# Patient Record
Sex: Female | Born: 1985 | Race: Black or African American | Hispanic: No | Marital: Married | Smoking: Former smoker
Health system: Southern US, Community
[De-identification: ages and names within clinical notes are randomized; demographics above are authoritative.]

## PROBLEM LIST (undated history)

## (undated) DIAGNOSIS — O139 Gestational [pregnancy-induced] hypertension without significant proteinuria, unspecified trimester: Secondary | ICD-10-CM

## (undated) DIAGNOSIS — J984 Other disorders of lung: Secondary | ICD-10-CM

## (undated) DIAGNOSIS — J45909 Unspecified asthma, uncomplicated: Secondary | ICD-10-CM

## (undated) DIAGNOSIS — F99 Mental disorder, not otherwise specified: Secondary | ICD-10-CM

## (undated) DIAGNOSIS — J3801 Paralysis of vocal cords and larynx, unilateral: Secondary | ICD-10-CM

## (undated) DIAGNOSIS — C8590 Non-Hodgkin lymphoma, unspecified, unspecified site: Secondary | ICD-10-CM

## (undated) DIAGNOSIS — F419 Anxiety disorder, unspecified: Secondary | ICD-10-CM

## (undated) DIAGNOSIS — G4700 Insomnia, unspecified: Secondary | ICD-10-CM

## (undated) HISTORY — DX: Insomnia, unspecified: G47.00

## (undated) HISTORY — PX: OTHER SURGICAL HISTORY: SHX169

## (undated) HISTORY — PX: WISDOM TOOTH EXTRACTION: SHX21

## (undated) HISTORY — PX: MOUTH SURGERY: SHX715

## (undated) HISTORY — PX: HYSTEROSCOPY: SHX211

---

## 2003-03-20 ENCOUNTER — Other Ambulatory Visit: Admission: RE | Admit: 2003-03-20 | Discharge: 2003-03-20 | Payer: Self-pay | Admitting: Obstetrics & Gynecology

## 2003-03-20 ENCOUNTER — Other Ambulatory Visit: Admission: RE | Admit: 2003-03-20 | Discharge: 2003-03-20 | Payer: Self-pay | Admitting: Obstetrics and Gynecology

## 2003-06-14 ENCOUNTER — Other Ambulatory Visit: Admission: RE | Admit: 2003-06-14 | Discharge: 2003-06-14 | Payer: Self-pay | Admitting: Obstetrics and Gynecology

## 2003-06-14 ENCOUNTER — Other Ambulatory Visit: Admission: RE | Admit: 2003-06-14 | Discharge: 2003-06-14 | Payer: Self-pay | Admitting: Obstetrics & Gynecology

## 2011-11-30 ENCOUNTER — Other Ambulatory Visit: Payer: Self-pay | Admitting: Occupational Medicine

## 2011-11-30 ENCOUNTER — Ambulatory Visit
Admission: RE | Admit: 2011-11-30 | Discharge: 2011-11-30 | Disposition: A | Discharge status: Home / Self Care | Payer: No Typology Code available for payment source | Source: Ambulatory Visit | Attending: Occupational Medicine | Admitting: Occupational Medicine

## 2011-11-30 DIAGNOSIS — M79673 Pain in unspecified foot: Secondary | ICD-10-CM

## 2012-01-26 ENCOUNTER — Encounter (HOSPITAL_COMMUNITY): Payer: Self-pay | Admitting: Emergency Medicine

## 2012-01-26 ENCOUNTER — Emergency Department (HOSPITAL_COMMUNITY)
Admission: EM | Admit: 2012-01-26 | Discharge: 2012-01-26 | Disposition: A | Discharge status: Home / Self Care | Payer: 59 | Source: Home / Self Care | Attending: Emergency Medicine | Admitting: Emergency Medicine

## 2012-01-26 DIAGNOSIS — R197 Diarrhea, unspecified: Secondary | ICD-10-CM

## 2012-01-26 DIAGNOSIS — R6889 Other general symptoms and signs: Secondary | ICD-10-CM

## 2012-01-26 DIAGNOSIS — J111 Influenza due to unidentified influenza virus with other respiratory manifestations: Secondary | ICD-10-CM

## 2012-01-26 MED ORDER — DIPHENOXYLATE-ATROPINE 2.5-0.025 MG PO TABS: 1.0000 | ORAL_TABLET | Freq: Four times a day (QID) | ORAL | Status: DC | PRN

## 2012-01-26 MED ORDER — GUAIFENESIN-CODEINE 100-10 MG/5ML PO SYRP: 5.0000 mL | ORAL_SOLUTION | Freq: Three times a day (TID) | ORAL | Status: DC | PRN

## 2012-01-26 NOTE — ED Notes (Signed)
Pt c/o cold/flu like sx x4 days Sx include: abd pain, diarrhea, sore throat, dysphagia, clear runny nose, nasal congestion, nauseas Denies: fevers, vomiting,  Has taken Pepto bismol   She is alert w/no signs of acute distress.

## 2012-01-26 NOTE — ED Provider Notes (Signed)
History     CSN: [id number]  Arrival date & time 01/26/12  1009   First MD Initiated Contact with Patient 01/26/12 1019      Chief Complaint  Patient presents with  . URI    (Consider location/radiation/quality/duration/timing/severity/associated sxs/prior treatment) HPI Comments: Patient presents to urgent care describing that for about 4 days she has been having flulike symptoms. Which she describes as having a cough nasal congestion bodyaches a runny nose and a sore throat. She has also been feeling nauseous and haven't had several episodes of liquidy diarrhea so the last several days. Patient describes that she has not taken her temperature but she has felt warm and having chills at time of sweating. Have been having cramping abdominal pains that start in her stomach area move all way down to her lower abdomen as it happens. She describes diarrheas are completely liquidy and have seen some greenish looking material in it. She denies any blood in her stools and denies having taken any oral antibiotics within the last 4 weeks. She goes into describing that she has seen a gastroenterologist and she has been diagnosed with IBS which she takes probiotics for it.  Denies any abdominal pain while sitting, denies any vomiting or urinary symptoms or shortness of breath  Patient is a 27 y.o. female presenting with URI. The history is provided by the patient.  URI The primary symptoms include fever, sore throat, cough, abdominal pain and nausea. Primary symptoms do not include wheezing, myalgias, arthralgias or rash. The current episode started 3 to 5 days ago. This is a new problem.  Symptoms associated with the illness include chills, congestion and rhinorrhea.    History reviewed. No pertinent past medical history.  Past Surgical History  Procedure Date  . Mouth surgery     No family history on file.  History  Substance Use Topics  . Smoking status: Never Smoker   . Smokeless  tobacco: Not on file  . Alcohol Use: No    OB History    Grav Para Term Preterm Abortions TAB SAB Ect Mult Living                  Review of Systems  Constitutional: Positive for fever, chills, activity change and appetite change.  HENT: Positive for congestion, sore throat and rhinorrhea.   Respiratory: Positive for cough. Negative for chest tightness and wheezing.   Cardiovascular: Negative for chest pain.  Gastrointestinal: Positive for nausea and abdominal pain.  Musculoskeletal: Negative for myalgias and arthralgias.  Skin: Negative for pallor and rash.  Neurological: Negative for dizziness.    Allergies  Review of patient's allergies indicates no known allergies.  Home Medications   Current Outpatient Rx  Name  Route  Sig  Dispense  Refill  . DIPHENOXYLATE-ATROPINE 2.5-0.025 MG PO TABS   Oral   Take 1 tablet by mouth 4 (four) times daily as needed for diarrhea or loose stools.   15 tablet   0   . GUAIFENESIN-CODEINE 100-10 MG/5ML PO SYRP   Oral   Take 5 mLs by mouth 3 (three) times daily as needed for cough.   120 mL   0     BP 131/93  Pulse 92  Temp 98.6 F (37 C) (Oral)  Resp 20  SpO2 96%  LMP 01/26/2012  Physical Exam  Nursing note and vitals reviewed. Constitutional: She appears well-developed and well-nourished. No distress.  HENT:  Head: Normocephalic.  Eyes: Conjunctivae normal are normal. No scleral icterus.  Neck: Neck supple. No JVD present.  Cardiovascular: Normal rate.  Exam reveals no gallop and no friction rub.   No murmur heard. Pulmonary/Chest: Effort normal and breath sounds normal.  Abdominal: Soft. Normal appearance. She exhibits no distension and no mass. There is no hepatosplenomegaly, splenomegaly or hepatomegaly. There is generalized tenderness. There is no rigidity, no rebound, no guarding, no CVA tenderness, no tenderness at McBurney's point and negative Murphy's sign. No hernia. Hernia confirmed negative in the ventral area.      Lymphadenopathy:    She has no cervical adenopathy.  Neurological: She is alert.  Skin: No rash noted. No erythema.    ED Course  Procedures (including critical care time)   Labs Reviewed  POCT RAPID STREP A ([facility]RG CARE ONLY)   No results found.   1. Influenza-like symptoms   2. Diarrhea       MDM  Viral syndrome, coexistent respiratory symptoms or gastrointestinal symptoms. Have instructed patient that if abdominal pain becomes constant or at rest or localizes to her right lower quadrant she will need immediate medical attention in the emergency department. Patient has been prescribed antidiarrheal medicine and also a cough suppressant and she's also having some upper respiratory symptoms and a bothersome cough. She was provided with a doctor's note for the next 48 hours. Vision agree treatment plan and followup care as necessary.        Jimmie Molly, MD 01/26/12 [phone]

## 2012-05-11 ENCOUNTER — Other Ambulatory Visit: Payer: Self-pay | Admitting: Obstetrics and Gynecology

## 2012-05-11 DIAGNOSIS — N644 Mastodynia: Secondary | ICD-10-CM

## 2012-05-11 DIAGNOSIS — R599 Enlarged lymph nodes, unspecified: Secondary | ICD-10-CM

## 2012-05-22 ENCOUNTER — Other Ambulatory Visit: Payer: Self-pay | Admitting: Obstetrics and Gynecology

## 2012-05-22 ENCOUNTER — Ambulatory Visit
Admission: RE | Admit: 2012-05-22 | Discharge: 2012-05-22 | Disposition: A | Discharge status: Home / Self Care | Payer: 59 | Source: Ambulatory Visit | Attending: Obstetrics and Gynecology | Admitting: Obstetrics and Gynecology

## 2012-05-22 DIAGNOSIS — N644 Mastodynia: Secondary | ICD-10-CM

## 2012-05-22 DIAGNOSIS — R599 Enlarged lymph nodes, unspecified: Secondary | ICD-10-CM

## 2012-06-01 ENCOUNTER — Ambulatory Visit: Payer: Self-pay | Admitting: Nurse Practitioner

## 2012-06-29 ENCOUNTER — Encounter: Payer: Self-pay | Admitting: Nurse Practitioner

## 2012-06-29 ENCOUNTER — Ambulatory Visit (INDEPENDENT_AMBULATORY_CARE_PROVIDER_SITE_OTHER): Payer: 59 | Admitting: Nurse Practitioner

## 2012-06-29 VITALS — BP 107/71 | HR 75 | Ht 67.5 in | Wt 220.0 lb

## 2012-06-29 DIAGNOSIS — F411 Generalized anxiety disorder: Secondary | ICD-10-CM | POA: Insufficient documentation

## 2012-06-29 DIAGNOSIS — G4700 Insomnia, unspecified: Secondary | ICD-10-CM

## 2012-06-29 MED ORDER — QUETIAPINE FUMARATE 100 MG PO TABS: ORAL_TABLET | ORAL | Status: DC

## 2012-06-29 NOTE — Patient Instructions (Addendum)
Continue Seroquel one half of 100 mg Nightly Will renew prescription Followup yearly and when necessary

## 2012-06-29 NOTE — Progress Notes (Signed)
HPI: Patient returns for her yearly followup last visit being 06/09/2011. She has a history of insomnia for which Seroquel has been beneficial. She currently gets  6-8 hours of sleep. Has not tolerated the Nuvigil , becoming jittery, and so she stopped taking it ( 50 mg) and feels now anyway refreshed enough to start her day. She is still on shift work. She has much less fatigue, more dreams,even more vivid when sleeping - she never had good dreams, often waking up tearful , scared. 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 She needs refills on her drugs     ROS:  Allergies  Physical Exam General: well developed, well nourished, seated, in no evident distress Head: head normocephalic and atraumatic. Oropharynx benign Neck: supple with no carotid  bruits Cardiovascular: regular rate and rhythm, no murmurs  Neurologic Exam Mental Status: Awake and fully alert. Oriented to place and time. Follows all commands. Speech and language normal.   Cranial Nerves: Pupils equal, briskly reactive to light. Extraocular movements full without nystagmus. Visual fields full to confrontation. Hearing intact and symmetric to finger snap. Facial sensation intact. Face, tongue, palate move normally and symmetrically. Neck flexion and extension normal.  Motor: Normal bulk and tone. Normal strength in all tested extremity muscles.No focal weakness Sensory.: intact to touch and pinprick and vibratory.  Coordination: Rapid alternating movements normal in all extremities. Finger-to-nose and heel-to-shin performed accurately bilaterally. Gait and Station: Arises from chair without difficulty. Stance is normal. Gait demonstrates normal stride length and balance . Able to heel, toe and tandem walk without difficulty.  Reflexes: 2+ and symmetric. Toes downgoing.     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

## 2012-07-19 ENCOUNTER — Other Ambulatory Visit: Payer: Self-pay

## 2012-07-19 MED ORDER — QUETIAPINE FUMARATE 100 MG PO TABS: 50.0000 mg | ORAL_TABLET | Freq: Every day | ORAL | Status: DC

## 2012-07-19 NOTE — Telephone Encounter (Signed)
Patient is requesting to have directions on Seroquel changed back to the previous dose listed in [facility] (1/2-1 tab qhs) rather than 1/2 qhs.  I consulted Eber Jones, she is okay with Rx being sent with the old directions.  Rx has been updated and sent.

## 2012-07-28 ENCOUNTER — Telehealth: Payer: Self-pay | Admitting: Nurse Practitioner

## 2012-07-28 NOTE — Telephone Encounter (Signed)
We sent #90 (90 day Rx) plus 3 refills which will last for one year.  I called the patient back.  I explained they may have only dispensed #30 because that is all her ins will allow, but they should have the remaining tabs to last one year on her file at the pharmacy.  She will follow up with her pharmacy and have them call us if needed.

## 2012-11-26 IMAGING — CR DG FOOT COMPLETE 3+V*R*
3 series · 3 of 3 positions shown · non-contrast
Comparison: None.

CLINICAL DATA: Pain

RIGHT FOOT COMPLETE - 3+ VIEW

[view not recorded (1 of 3)]
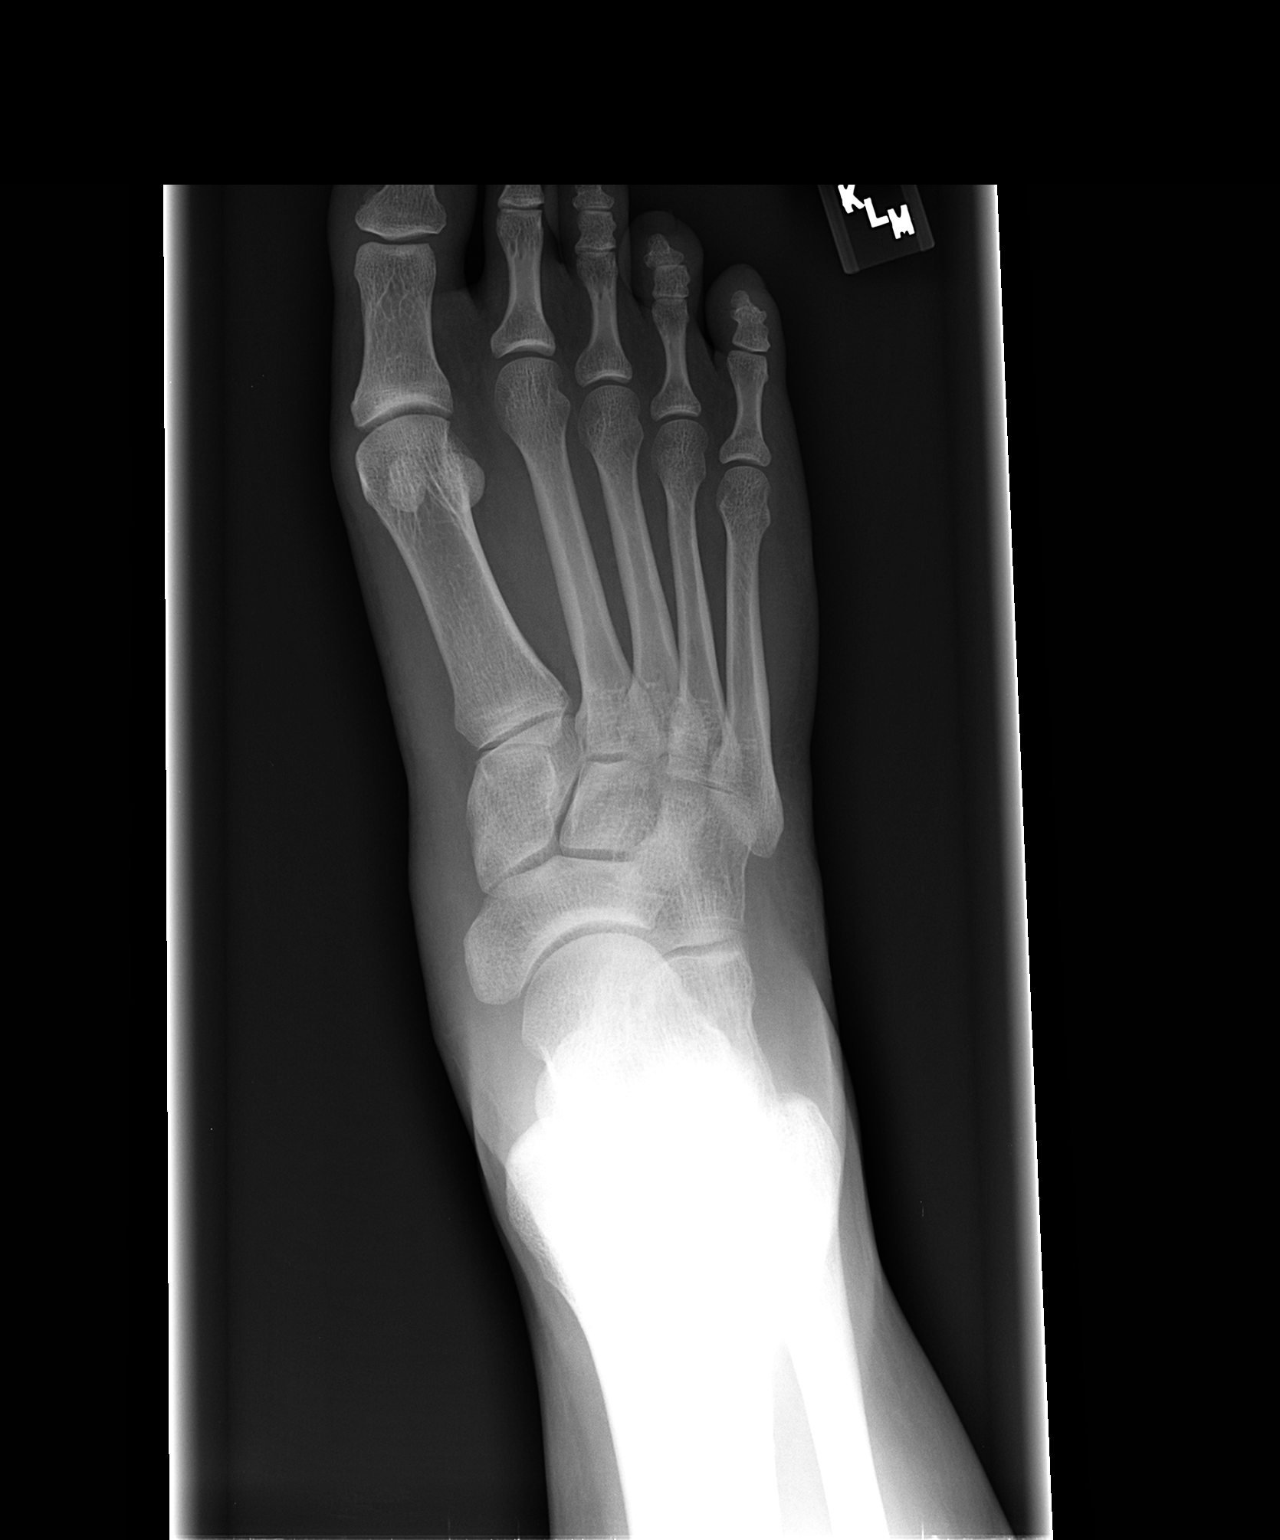

[view not recorded (2 of 3)]
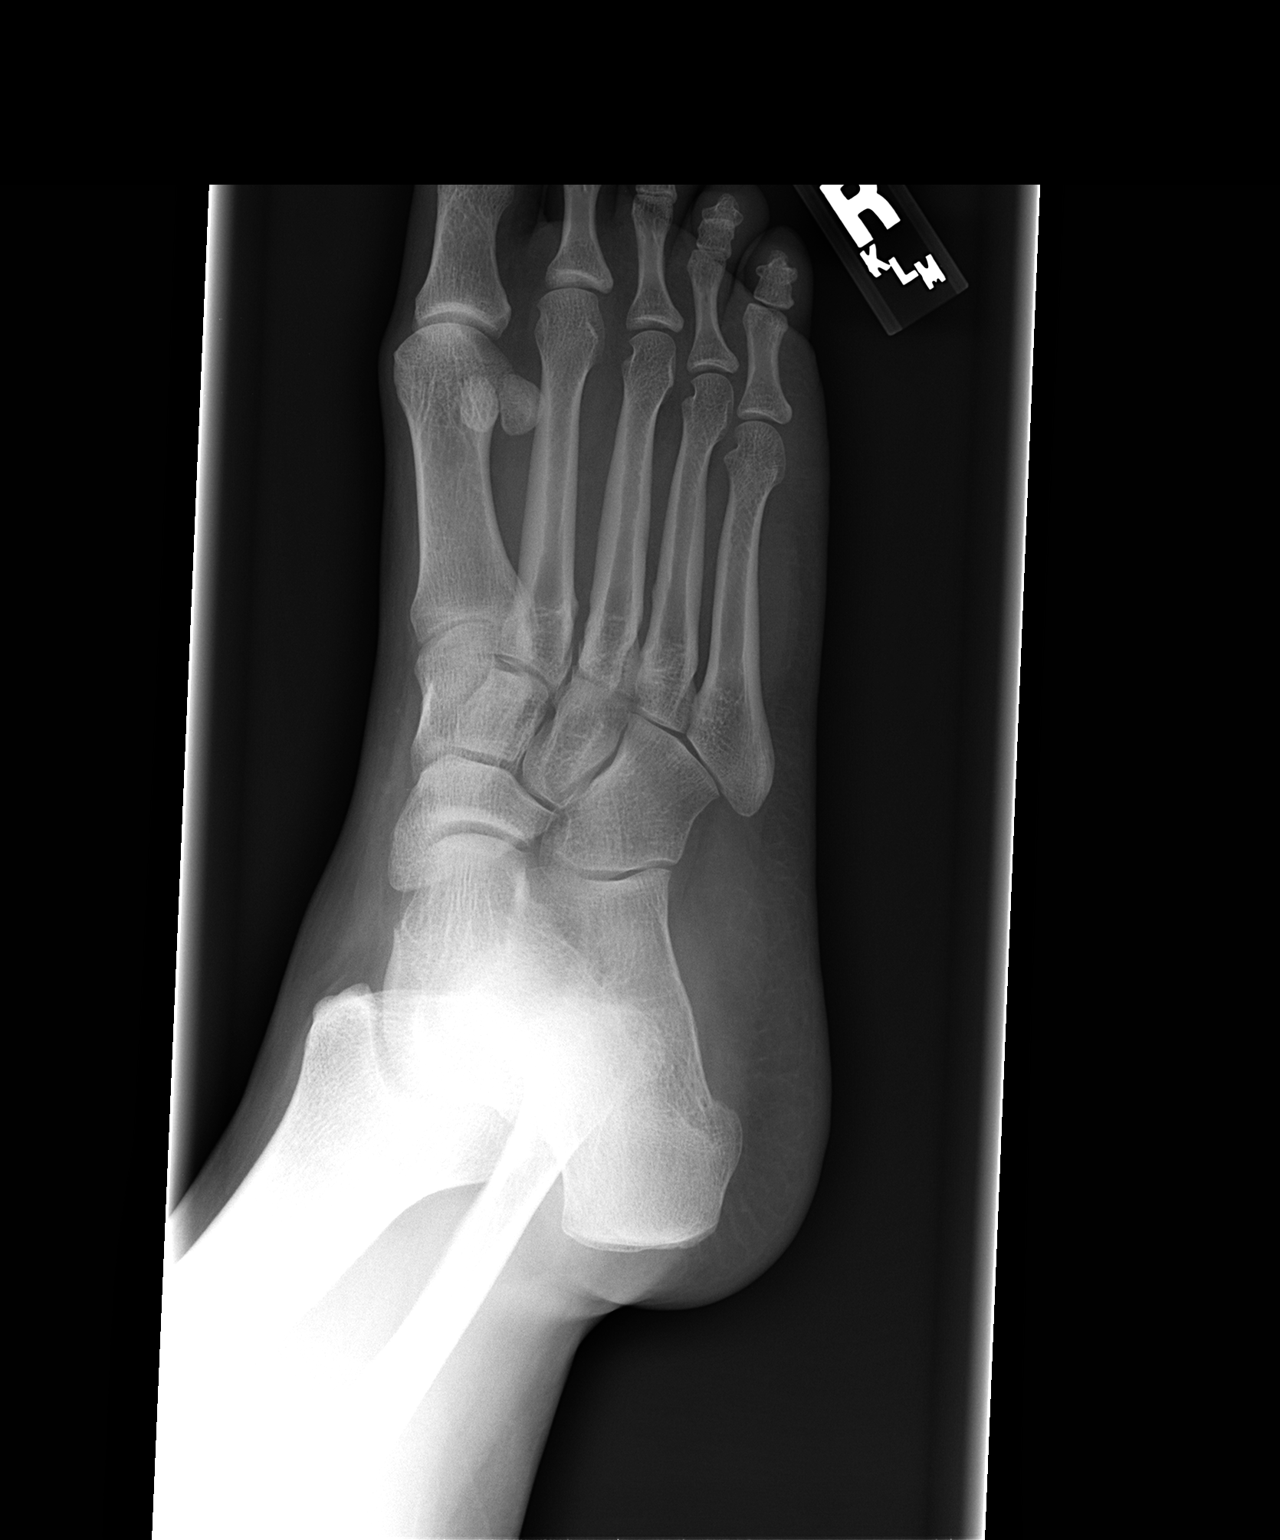

[view not recorded (3 of 3)]
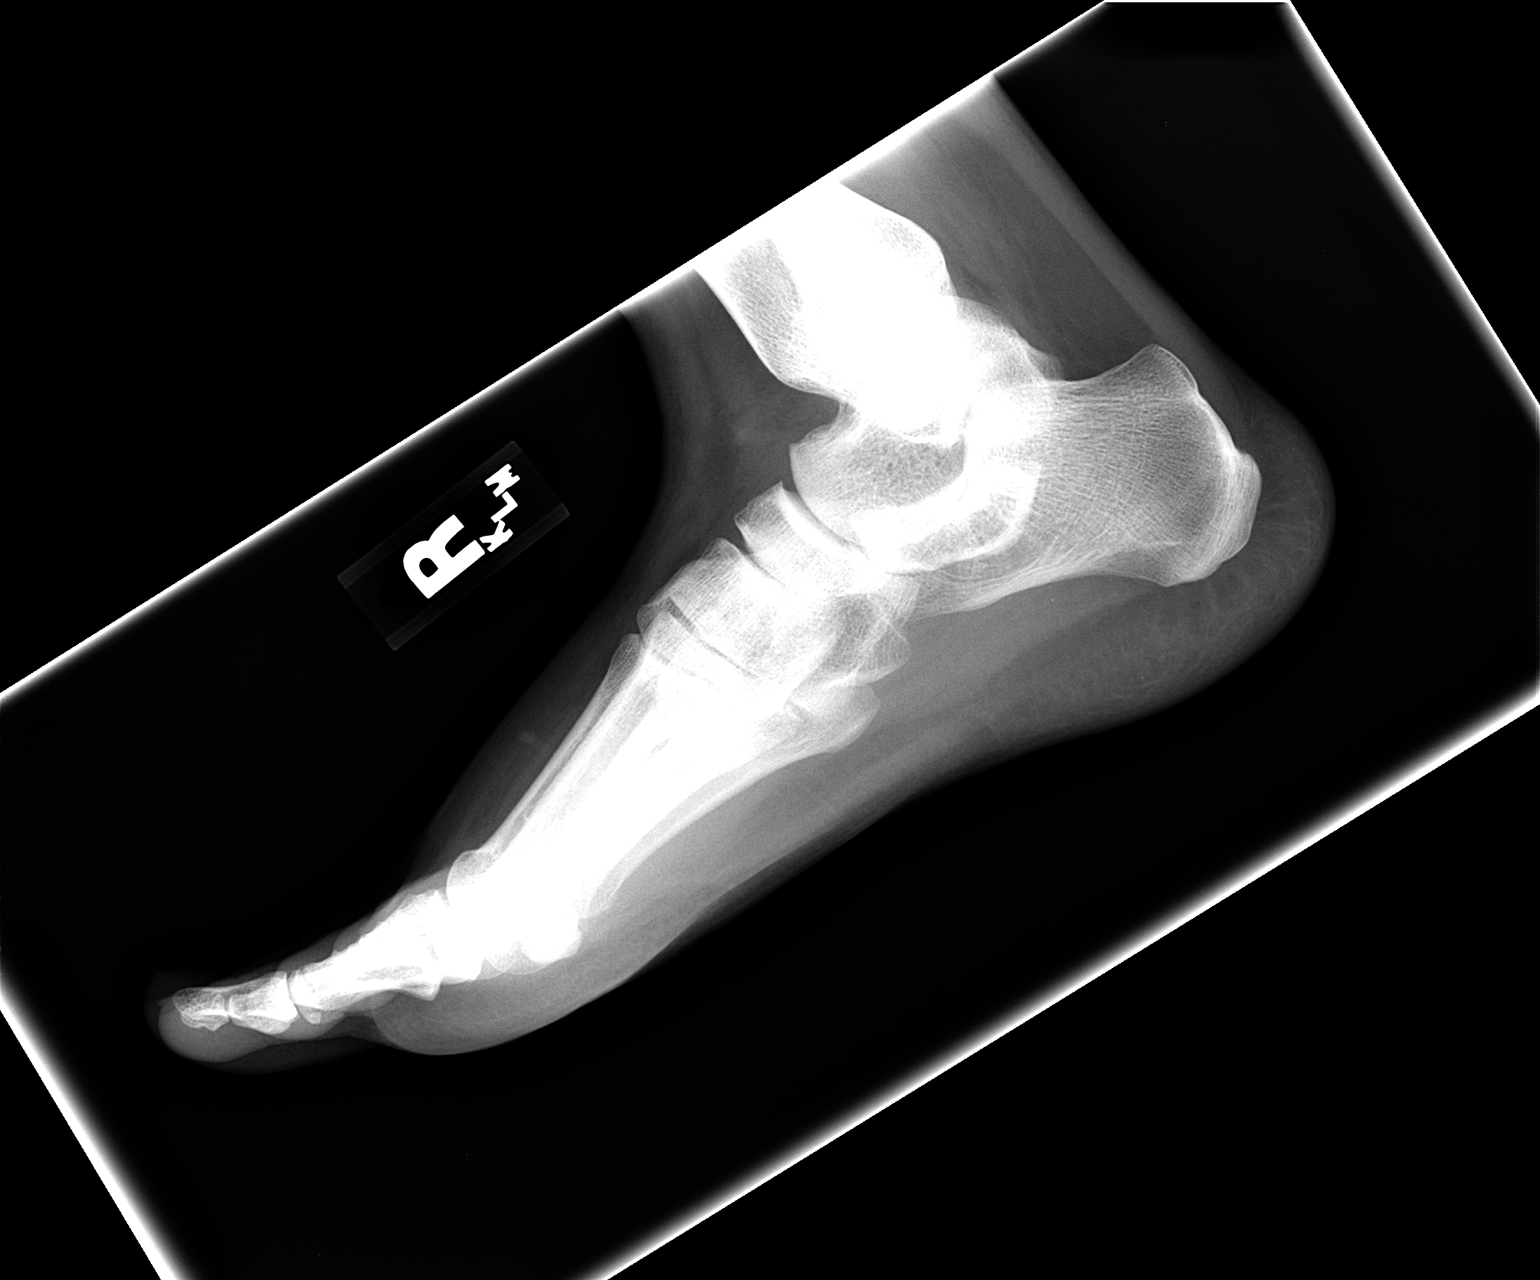

[3 of 3 positions shown; findings below may reference images not displayed]

FINDINGS: Frontal, oblique, and lateral views were obtained.  No
fracture or dislocation.  Joint spaces appear intact.  No erosive
change.
IMPRESSION: No abnormality noted.

## 2012-12-09 ENCOUNTER — Encounter (HOSPITAL_COMMUNITY): Payer: Self-pay | Admitting: Emergency Medicine

## 2012-12-09 ENCOUNTER — Emergency Department (HOSPITAL_COMMUNITY)
Admission: EM | Admit: 2012-12-09 | Discharge: 2012-12-09 | Disposition: A | Discharge status: Home / Self Care | Payer: Worker's Compensation | Attending: Emergency Medicine | Admitting: Emergency Medicine

## 2012-12-09 DIAGNOSIS — IMO0002 Reserved for concepts with insufficient information to code with codable children: Secondary | ICD-10-CM | POA: Insufficient documentation

## 2012-12-09 DIAGNOSIS — G4700 Insomnia, unspecified: Secondary | ICD-10-CM | POA: Insufficient documentation

## 2012-12-09 DIAGNOSIS — S161XXA Strain of muscle, fascia and tendon at neck level, initial encounter: Secondary | ICD-10-CM

## 2012-12-09 DIAGNOSIS — Y92410 Unspecified street and highway as the place of occurrence of the external cause: Secondary | ICD-10-CM | POA: Insufficient documentation

## 2012-12-09 DIAGNOSIS — Y939 Activity, unspecified: Secondary | ICD-10-CM | POA: Insufficient documentation

## 2012-12-09 DIAGNOSIS — S46912A Strain of unspecified muscle, fascia and tendon at shoulder and upper arm level, left arm, initial encounter: Secondary | ICD-10-CM

## 2012-12-09 DIAGNOSIS — M25529 Pain in unspecified elbow: Secondary | ICD-10-CM | POA: Insufficient documentation

## 2012-12-09 DIAGNOSIS — Z87891 Personal history of nicotine dependence: Secondary | ICD-10-CM | POA: Insufficient documentation

## 2012-12-09 DIAGNOSIS — Z79899 Other long term (current) drug therapy: Secondary | ICD-10-CM | POA: Insufficient documentation

## 2012-12-09 DIAGNOSIS — S139XXA Sprain of joints and ligaments of unspecified parts of neck, initial encounter: Secondary | ICD-10-CM | POA: Insufficient documentation

## 2012-12-09 MED ORDER — METHOCARBAMOL 500 MG PO TABS: 500.0000 mg | ORAL_TABLET | Freq: Two times a day (BID) | ORAL | Status: DC

## 2012-12-09 NOTE — ED Provider Notes (Signed)
Medical screening examination/treatment/procedure(s) were performed by non-physician practitioner and as supervising physician I was immediately available for consultation/collaboration.  EKG Interpretation   None        Doug Sou, MD 12/09/12 1624
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
## 2013-06-26 ENCOUNTER — Ambulatory Visit: Payer: 59 | Admitting: Nurse Practitioner

## 2013-07-31 ENCOUNTER — Other Ambulatory Visit: Payer: Self-pay

## 2013-07-31 MED ORDER — QUETIAPINE FUMARATE 100 MG PO TABS: 50.0000 mg | ORAL_TABLET | Freq: Every day | ORAL | Status: DC

## 2013-08-20 ENCOUNTER — Encounter: Payer: Self-pay | Admitting: *Deleted

## 2013-08-20 ENCOUNTER — Telehealth: Payer: Self-pay | Admitting: *Deleted

## 2013-08-20 NOTE — Telephone Encounter (Signed)
Called patient and left a message that her appt is canceled and needs to be r/s. Patient is to call the office back.

## 2013-08-20 NOTE — Telephone Encounter (Signed)
Called patient no answer. Changed patient appt to 130 on the same day 08-29-13. Will mail a letter.

## 2013-08-29 ENCOUNTER — Ambulatory Visit (INDEPENDENT_AMBULATORY_CARE_PROVIDER_SITE_OTHER): Payer: 59 | Admitting: Nurse Practitioner

## 2013-08-29 ENCOUNTER — Encounter: Payer: Self-pay | Admitting: Nurse Practitioner

## 2013-08-29 VITALS — BP 117/79 | HR 76 | Ht 67.5 in | Wt 201.4 lb

## 2013-08-29 DIAGNOSIS — G4700 Insomnia, unspecified: Secondary | ICD-10-CM

## 2013-08-29 DIAGNOSIS — F411 Generalized anxiety disorder: Secondary | ICD-10-CM

## 2013-08-29 MED ORDER — QUETIAPINE FUMARATE 100 MG PO TABS: 100.0000 mg | ORAL_TABLET | Freq: Every day | ORAL | Status: DC

## 2013-08-29 NOTE — Progress Notes (Signed)
I agree with the assessment and plan as directed by NP .The patient is known to me .   Laury Huizar, MD  

## 2013-08-29 NOTE — Patient Instructions (Signed)
Continue Seroquel at current dose will refill Please continue followup for anxiety disorder with psychiatry Followup yearly and when necessary

## 2013-08-29 NOTE — Progress Notes (Signed)
[facility]  [name] DOB: 28-May-1985   REASON FOR VISIT: Followup for insomnia   HISTORY OF PRESENT ILLNESS:[name], 28 year old female returns for followup. She was last seen in this office 06/29/2012.  She has a history of insomnia for which Seroquel has been beneficial. She currently gets 6-8 hours of sleep. Has not tolerated the Nuvigil , becoming jittery, and so she stopped taking it ( 50 mg) and feels now anyway refreshed enough to start her day. She is still on shift work. She has much less fatigue, more dreams,even more vivid when sleeping - she never had good dreams, often waking up tearful , scared. 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 She needs refills on her drugs . She has a new complaint of anxiety disorder however she is in counseling and is seeing a psychiatrist.   REVIEW OF SYSTEMS: Full 14 system review of systems performed and notable only for those listed, all others are neg:  Constitutional: N/A  Cardiovascular: N/A  Ear/Nose/Throat: N/A  Skin: N/A  Eyes: N/A  Respiratory: N/A  Gastroitestinal: N/A  Hematology/Lymphatic: N/A  Endocrine: Heat. and cold intolerance  Musculoskeletal:N/A  Allergy/Immunology: Environmental allergies Neurological: Back pain Psychiatric: Anxiety Sleep : Insomnia   ALLERGIES: Allergies  Allergen Reactions  . Penicillins Hives    Confusion    HOME MEDICATIONS: Outpatient Prescriptions Prior to Visit  Medication Sig Dispense Refill  . JUNEL FE 1.5/30 1.5-30 MG-MCG tablet       . QUEtiapine (SEROQUEL) 100 MG tablet Take 0.5-1 tablets (50-100 mg total) by mouth at bedtime.  90 tablet  0  . methocarbamol (ROBAXIN) 500 MG tablet Take 1 tablet (500 mg total) by mouth 2 (two) times daily.  20 tablet  0   No facility-administered medications prior to visit.     PAST MEDICAL HISTORY: Past Medical History  Diagnosis Date  . Insomnia     PAST SURGICAL HISTORY: Past Surgical History  Procedure Laterality Date  . Mouth surgery      FAMILY HISTORY: History reviewed. No pertinent family history.  SOCIAL HISTORY: History   Social History  . Marital Status: Single    Spouse Name: N/A    Number of Children: 0  . Years of Education: 12+   Occupational History  . POLICE DEPARTMENT    Social History Main Topics  . Smoking status: Former Games developer  . Smokeless tobacco: Never Used  . Alcohol Use: No  . Drug Use: No  . Sexual Activity: Not on file   Other Topics Concern  . Not on file   Social History Narrative   Patient lives at home Alone.    Patient has no children.    Patient is right handed.    Patient has a BA in science.    Patient is single.            PHYSICAL EXAM  Filed Vitals:   08/29/13 1334  BP: 117/79  Pulse: 76  Height: 5' 7.5" (1.715 m)  Weight: 201 lb 6.4 oz (91.354 kg)   Body mass index is 31.06 kg/(m^2). General: well developed, well nourished, seated, in no evident distress  Head: head normocephalic and atraumatic. Oropharynx benign  Neck: supple with no carotid bruits   Neurologic Exam  Mental Status: Awake and fully alert. Oriented to place and time. Follows all commands. Speech and  language normal.  Cranial Nerves: Pupils equal, briskly reactive to light. Extraocular movements full without nystagmus. Visual fields full to confrontation. Hearing intact and symmetric to finger snap. Facial sensation intact. Face, tongue, palate move normally and symmetrically. Neck flexion and extension normal.  Motor: Normal bulk and tone. Normal strength in all tested extremity muscles.No focal weakness  Sensory.: intact to touch and pinprick and vibratory.  Coordination: Rapid alternating movements normal in all extremities. Finger-to-nose and heel-to-shin performed accurately bilaterally.  Gait and Station:  Arises from chair without difficulty. Stance is normal. Gait demonstrates normal stride length and balance . Able to heel, toe and tandem walk without difficulty.  Reflexes: 2+ and symmetric. Toes downgoing.     DIAGNOSTIC DATA (LABS, IMAGING, TESTING) - ASSESSMENT AND PLAN  28 y.o. year old female  has a past medical history of Insomnia. here to followup. She also has anxiety disorder treated by psychiatry.  Continue Seroquel at current dose will refill Please continue followup for anxiety disorder with psychiatry Followup yearly and when necessary Briana Pham, [facility], [facility], APRN  [facility] [address] [place], [place] [address] [phone]

## 2013-11-28 ENCOUNTER — Encounter: Payer: Self-pay | Admitting: Neurology

## 2014-08-19 ENCOUNTER — Ambulatory Visit (INDEPENDENT_AMBULATORY_CARE_PROVIDER_SITE_OTHER): Payer: 59 | Admitting: Nurse Practitioner

## 2014-08-19 ENCOUNTER — Encounter: Payer: Self-pay | Admitting: Nurse Practitioner

## 2014-08-19 VITALS — BP 121/80 | HR 71 | Ht 67.5 in | Wt 211.2 lb

## 2014-08-19 DIAGNOSIS — G4700 Insomnia, unspecified: Secondary | ICD-10-CM | POA: Diagnosis not present

## 2014-08-19 MED ORDER — QUETIAPINE FUMARATE 100 MG PO TABS: 100.0000 mg | ORAL_TABLET | Freq: Every day | ORAL | Status: DC

## 2014-08-19 NOTE — Progress Notes (Signed)
[facility]  [name] DOB: Jul 31, 1985   REASON FOR VISIT: Follow-up for insomnia HISTORY FROM: Patient    HISTORY OF PRESENT ILLNESS:Briana Pham, 29 year old female returns for followup. She was last seen in this office 08/29/13. She has a history of insomnia for which Seroquel has been beneficial. She currently gets 6-8 hours of sleep. Has not tolerated the Nuvigil , becoming jittery, and so she stopped taking it ( 50 mg) and feels now anyway refreshed enough to start her day.  She has much less fatigue. 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 She needs refills on her drugs .   REVIEW OF SYSTEMS: Full 14 system review of systems performed and notable only for those listed, all others are neg:  Constitutional: neg  Cardiovascular: neg Ear/Nose/Throat: neg  Skin: neg Eyes: neg Respiratory: neg Gastroitestinal: neg  Hematology/Lymphatic: neg  Endocrine: neg Musculoskeletal:neg Allergy/Immunology: neg Neurological: neg Psychiatric: neg Sleep : Insomnia ALLERGIES: Allergies  Allergen Reactions  . Penicillins Hives    Confusion    HOME MEDICATIONS: Outpatient Prescriptions Prior to Visit  Medication Sig Dispense Refill  . JUNEL FE 1.5/30 1.5-30 MG-MCG tablet     . Lorcaserin HCl (BELVIQ) 10 MG TABS Take 10 mg by mouth.    . QUEtiapine (SEROQUEL) 100 MG tablet Take 1 tablet (100 mg total) by mouth at bedtime. 90 tablet 3   No facility-administered medications prior to visit.    PAST MEDICAL HISTORY: Past Medical History  Diagnosis Date  . Insomnia     PAST SURGICAL HISTORY: Past Surgical History  Procedure Laterality Date  . Mouth surgery      FAMILY HISTORY: History reviewed. No pertinent family history.  SOCIAL HISTORY: Social History   Social History  . Marital Status: Single    Spouse  Name: N/A  . Number of Children: 0  . Years of Education: 12+   Occupational History  . POLICE DEPARTMENT    Social History Main Topics  . Smoking status: Former Games developer  . Smokeless tobacco: Never Used  . Alcohol Use: No  . Drug Use: No  . Sexual Activity: Not on file   Other Topics Concern  . Not on file   Social History Narrative   Patient lives at home Alone.    Patient has no children.    Patient is right handed.    Patient has a BA in science.    Patient is single.            PHYSICAL EXAM  Filed Vitals:   08/19/14 1021  BP: 121/80  Pulse: 71  Height: 5' 7.5" (1.715 m)  Weight: 211 lb 3.2 oz (95.8 kg)   Body mass index is 32.57 kg/(m^2). General: well developed, well nourished, seated, in no evident distress  Head: head normocephalic and atraumatic. Oropharynx benign  Neck: supple with no carotid bruits   Neurologic Exam  Mental Status: Awake and fully alert. Oriented to place and time. Follows all commands. Speech and language normal.  Cranial Nerves: Pupils equal, briskly reactive to light. Extraocular movements full without nystagmus. Visual fields full to confrontation. Hearing intact and symmetric to finger snap. Facial sensation intact. Face, tongue, palate move normally and symmetrically. Neck flexion and extension normal.  Motor: Normal bulk and tone. Normal strength in all tested extremity muscles.No focal weakness  Sensory.: intact to touch and  pinprick and vibratory.  Coordination: Rapid alternating movements normal in all extremities. Finger-to-nose and heel-to-shin performed accurately bilaterally.  Gait and Station: Arises from chair without difficulty. Stance is normal. Gait demonstrates normal stride length and balance . Able to heel, toe and tandem walk without difficulty.  Reflexes: 2+ and symmetric. Toes downgoing.     DIAGNOSTIC DATA (LABS, IMAGING, TESTING) - ASSESSMENT AND PLAN  29 y.o. year old female  has a past medical  history of Insomnia. here to follow-up. She is currently on Seroquel. She needs refills.  Continue Seroquel at current dose will refill F/U yearly Nilda Riggs, [facility], [facility], APRN  [facility] [address], [address] [place], [place] [address] [phone]

## 2014-08-19 NOTE — Patient Instructions (Signed)
Continue Seroquel at current dose will refill F/U yearly  

## 2014-08-19 NOTE — Progress Notes (Signed)
I agree with the assessment and plan as directed by NP .The patient is known to me .   Hayden Kihara, MD  

## 2014-08-30 ENCOUNTER — Ambulatory Visit: Payer: 59 | Admitting: Nurse Practitioner

## 2015-06-22 ENCOUNTER — Other Ambulatory Visit: Payer: Self-pay | Admitting: Nurse Practitioner

## 2015-06-24 ENCOUNTER — Other Ambulatory Visit: Payer: Self-pay | Admitting: Nurse Practitioner

## 2015-08-19 ENCOUNTER — Ambulatory Visit (INDEPENDENT_AMBULATORY_CARE_PROVIDER_SITE_OTHER): Payer: 59 | Admitting: Nurse Practitioner

## 2015-08-19 ENCOUNTER — Encounter: Payer: Self-pay | Admitting: Nurse Practitioner

## 2015-08-19 VITALS — BP 126/89 | HR 77 | Ht 67.5 in | Wt 225.0 lb

## 2015-08-19 DIAGNOSIS — G4700 Insomnia, unspecified: Secondary | ICD-10-CM | POA: Diagnosis not present

## 2015-08-19 DIAGNOSIS — F411 Generalized anxiety disorder: Secondary | ICD-10-CM

## 2015-08-19 MED ORDER — QUETIAPINE FUMARATE 100 MG PO TABS: ORAL_TABLET | ORAL | 11 refills | Status: DC

## 2015-08-19 NOTE — Progress Notes (Signed)
[facility]  [name] DOB: 1985-10-12   REASON FOR VISIT: Follow-up for insomnia HISTORY FROM: Patient    HISTORY OF PRESENT ILLNESS:Briana Pham, 30 year old female returns for yearly followup.  She has a history of insomnia for which Seroquel has been beneficial. She currently gets 6-8 hours of sleep. Could not  Tolerate Nuvigil , becoming jittery.  She has much less fatigue. She works as a Nurse, adultpoliceman. She is doing well at work. She is currently going through counseling with her mother. She has been estranged from her for years. She is on Xanax by PCP prn.     REVIEW OF SYSTEMS: Full 14 system review of systems performed and notable only for those listed, all others are neg:  Constitutional: neg  Cardiovascular: neg Ear/Nose/Throat: neg  Skin: neg Eyes: neg Respiratory: neg Gastroitestinal: neg  Hematology/Lymphatic: neg  Endocrine: neg Musculoskeletal:neg Allergy/Immunology: neg Neurological: neg Psychiatric: neg Sleep : Insomnia ALLERGIES: Allergies  Allergen Reactions  . Zithromax [Azithromycin] Anaphylaxis    Rash, crawly skin, could not form sentences  . Penicillins Hives    Confusion    HOME MEDICATIONS: Outpatient Medications Prior to Visit  Medication Sig Dispense Refill  . JUNEL FE 1.5/30 1.5-30 MG-MCG tablet     . QUEtiapine (SEROQUEL) 100 MG tablet TAKE 1 TABLET (100 MG TOTAL) BY MOUTH AT BEDTIME. 90 tablet 0  . Lorcaserin HCl (BELVIQ) 10 MG TABS Take 10 mg by mouth.     No facility-administered medications prior to visit.     PAST MEDICAL HISTORY: Past Medical History:  Diagnosis Date  . Insomnia     PAST SURGICAL HISTORY: Past Surgical History:  Procedure Laterality Date  . MOUTH SURGERY      FAMILY HISTORY: History reviewed. No pertinent family history.  SOCIAL HISTORY: Social History   Social History  . Marital status: Single    Spouse name: N/A  . Number of children: 0  . Years of  education: 12+   Occupational History  . POLICE DEPARTMENT [employer]    Social History Main Topics  . Smoking status: Former Games developermoker  . Smokeless tobacco: Never Used  . Alcohol use No  . Drug use: No  . Sexual activity: Not on file   Other Topics Concern  . Not on file   Social History Narrative   Patient lives at home Alone.    Patient has no children.    Patient is right handed.    Patient has a BA in science.    Patient is single.            PHYSICAL EXAM  Vitals:   08/19/15 1026  BP: 126/89  Pulse: 77  Weight: 225 lb (102.1 kg)  Height: 5' 7.5" (1.715 m)   Body mass index is 34.72 kg/m. General: well developed, well nourished, seated, in no evident distress  Head: head normocephalic and atraumatic. Oropharynx benign  Neck: supple with no carotid bruits   Neurologic Exam  Mental Status: Awake and fully alert. Oriented to place and time. Follows all commands. Speech and language normal. ESS 0. FSS 26 Cranial Nerves: Pupils equal, briskly reactive to light. Extraocular movements full without nystagmus. Visual fields full to confrontation. Hearing intact and symmetric to finger snap. Facial sensation intact. Face, tongue, palate move normally and symmetrically. Neck flexion and extension normal.  Motor: Normal bulk and tone. Normal strength in all tested extremity muscles.No focal weakness  Sensory.: intact to touch and pinprick and vibratory.  Coordination: Rapid  alternating movements normal in all extremities. Finger-to-nose and heel-to-shin performed accurately bilaterally.  Gait and Station: Arises from chair without difficulty. Stance is normal. Gait demonstrates normal stride length and balance . Able to heel, toe and tandem walk without difficulty.  Reflexes: 2+ and symmetric. Toes downgoing.     DIAGNOSTIC DATA (LABS, IMAGING, TESTING) - ASSESSMENT AND PLAN  30 y.o. year old female  has a past medical history of Insomnia. here to  follow-up. She is currently on Seroquel. She needs refills.  Continue Seroquel at current dose will refill F/U yearly Nilda RiggsNancy Carolyn Martin, [facility] HealthGNP, [facility]BC, APRN  [facility] [address], [address] [place], [place] [address] [phone]

## 2015-08-19 NOTE — Patient Instructions (Signed)
Continue Seroquel at current dose will refill F/U yearly  

## 2015-08-19 NOTE — Progress Notes (Signed)
I agree with the assessment and plan as directed by NP .The patient is known to me .   Cambre Matson, MD  

## 2015-10-06 ENCOUNTER — Other Ambulatory Visit: Payer: Self-pay

## 2015-10-06 MED ORDER — QUETIAPINE FUMARATE 100 MG PO TABS: ORAL_TABLET | ORAL | 3 refills | Status: DC

## 2015-12-19 ENCOUNTER — Telehealth: Payer: Self-pay | Admitting: Neurology

## 2015-12-19 NOTE — Telephone Encounter (Signed)
Dr. Babs BertinAlexander Gill patient's dentist is calling to discuss the patient's sleep issues and to find out if she has sleep apnea. He states she grinds her teeth. I advised Dr. Delane GingerGill that Dr. Vickey Hugerohmeier and her nurse are out of the office today and he said a returned call on Monday would be fine.

## 2015-12-22 NOTE — Telephone Encounter (Signed)
I do not see a sleep study scanned into his chart, can you help with this?

## 2015-12-23 NOTE — Telephone Encounter (Signed)
Patient has not had sleep study unless it was over 10 years ago. No data in Epic or office visit with Dohmeier. We do not know if she has apnea unless we do a sleep study. Does dentist want her to have a sleep study now? Can you send this message to dentist? Thanks

## 2015-12-23 NOTE — Telephone Encounter (Signed)
I called their office back but they are closed at this time. Will try back later.

## 2016-01-01 NOTE — Telephone Encounter (Signed)
Tried calling office again. Their office does not re-open until January 2nd.

## 2016-01-13 NOTE — Telephone Encounter (Signed)
LM at the office that patient has never had a sleep study. If Dr. Delane GingerGill would like to discuss further he is welcome to call back an speak with Dr. Vickey Hugerohmeier.

## 2016-08-18 ENCOUNTER — Ambulatory Visit: Payer: 59 | Admitting: Nurse Practitioner

## 2016-09-06 NOTE — Progress Notes (Signed)
[facility]  [name] DOB: [birth year]   REASON FOR VISIT: Follow-up for insomnia HISTORY FROM: Patient    HISTORY OF PRESENT ILLNESS:Briana Pham, 31 year old female returns for yearly followup.  She has a history of insomnia for which Seroquel has been beneficial. She currently gets 6-8 hours of sleep. Could not  Tolerate Nuvigil , becoming jittery.  She has much less fatigue. 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  She is on Xanax by PCP prn. She returns for reevaluation    REVIEW OF SYSTEMS: Full 14 system review of systems performed and notable only for those listed, all others are neg:  Constitutional: neg  Cardiovascular: neg Ear/Nose/Throat: neg  Skin: neg Eyes: neg Respiratory: neg Gastroitestinal: neg  Hematology/Lymphatic: neg  Endocrine: neg Musculoskeletal:neg Allergy/Immunology: Environmental allergies. Allergies Neurological: neg Psychiatric: neg Sleep : Insomnia ALLERGIES: Allergies  Allergen Reactions  . Zithromax [Azithromycin] Anaphylaxis    Rash, crawly skin, could not form sentences  . Penicillins Hives    Confusion    HOME MEDICATIONS: Outpatient Medications Prior to Visit  Medication Sig Dispense Refill  . albuterol (PROVENTIL) (2.5 MG/3ML) 0.083% nebulizer solution Take 2.5 mg by nebulization as needed for wheezing or shortness of breath.    . ALPRAZolam (XANAX) 0.5 MG tablet Take 0.5 mg by mouth at bedtime as needed.     Marland Kitchen EPINEPHrine 0.3 mg/0.3 mL IJ SOAJ injection See admin instructions.  1  . JUNEL FE 1.5/30 1.5-30 MG-MCG tablet     . Lorcaserin HCl (BELVIQ) 10 MG TABS Take 10 mg by mouth.    . QUEtiapine (SEROQUEL) 100 MG tablet TAKE 1 TABLET (100 MG TOTAL) BY MOUTH AT BEDTIME. 90 tablet 3   No facility-administered medications prior to visit.     PAST MEDICAL HISTORY: Past Medical History:  Diagnosis Date  . Insomnia     PAST SURGICAL HISTORY: Past Surgical History:    Procedure Laterality Date  . MOUTH SURGERY      FAMILY HISTORY: History reviewed. No pertinent family history.  SOCIAL HISTORY: Social History   Social History  . Marital status: Single    Spouse name: N/A  . Number of children: 0  . Years of education: 12+   Occupational History  . POLICE DEPARTMENT [employer]   Social History Main Topics  . Smoking status: Former Games developer  . Smokeless tobacco: Never Used  . Alcohol use No  . Drug use: No  . Sexual activity: Not on file   Other Topics Concern  . Not on file   Social History Narrative   Patient lives at home Alone.    Patient has no children.    Patient is right handed.    Patient has a BA in science.    Patient is single.            PHYSICAL EXAM  Vitals:   09/07/16 0834  BP: 125/81  Pulse: 77  Weight: 229 lb (103.9 kg)   Body mass index is 35.34 kg/m. General: well developed, well nourished, seated, in no evident distress  Head: head normocephalic and atraumatic. Oropharynx benign  Neck: supple with no carotid bruits   Neurologic Exam  Mental Status: Awake and fully alert. Oriented to place and time. Follows all commands. Speech and language normal.  Cranial Nerves: Pupils equal, briskly reactive to light. Extraocular movements full without nystagmus. Visual fields full to confrontation. Hearing intact and symmetric to finger snap. Facial sensation intact. Face,  tongue, palate move normally and symmetrically. Neck flexion and extension normal.  Motor: Normal bulk and tone. Normal strength in all tested extremity muscles.No focal weakness  Sensory.: intact to touch and pinprick and vibratory.  Coordination: Rapid alternating movements normal in all extremities. Finger-to-nose and heel-to-shin performed accurately bilaterally.  Gait and Station: Arises from chair without difficulty. Stance is normal. Gait demonstrates normal stride length and balance . Able to heel, toe and tandem walk  without difficulty.  Reflexes: 2+ and symmetric. Toes downgoing.     DIAGNOSTIC DATA (LABS, IMAGING, TESTING) - ASSESSMENT AND PLAN  31 y.o. year old female  has a past medical history of Insomnia. here to follow-up. She is currently on Seroquel. She needs refills. She takes Xanax when necessary from primary care  Continue Seroquel at current dose will refill F/U yearly Briana Pham, [facility] WasecaGNP, [facility] CenterBC, APRN  [facility] [address], [address] [place], [place] [address] [phone]

## 2016-09-07 ENCOUNTER — Ambulatory Visit (INDEPENDENT_AMBULATORY_CARE_PROVIDER_SITE_OTHER): Payer: 59 | Admitting: Nurse Practitioner

## 2016-09-07 ENCOUNTER — Encounter: Payer: Self-pay | Admitting: Nurse Practitioner

## 2016-09-07 VITALS — BP 125/81 | HR 77 | Wt 229.0 lb

## 2016-09-07 DIAGNOSIS — G4700 Insomnia, unspecified: Secondary | ICD-10-CM | POA: Diagnosis not present

## 2016-09-07 MED ORDER — QUETIAPINE FUMARATE 100 MG PO TABS: ORAL_TABLET | ORAL | 3 refills | Status: DC

## 2016-09-07 NOTE — Patient Instructions (Signed)
Continue Seroquel at current dose will refill F/U yearly

## 2016-09-07 NOTE — Progress Notes (Signed)
I agree with the assessment and plan as directed by NP .The patient is well known to me .   Hitomi Slape, MD  

## 2017-08-18 ENCOUNTER — Ambulatory Visit: Payer: 59 | Admitting: Nurse Practitioner

## 2017-09-06 ENCOUNTER — Ambulatory Visit: Payer: 59 | Admitting: Nurse Practitioner

## 2017-09-21 ENCOUNTER — Other Ambulatory Visit: Payer: Self-pay | Admitting: Nurse Practitioner

## 2018-06-27 DIAGNOSIS — Z8709 Personal history of other diseases of the respiratory system: Secondary | ICD-10-CM | POA: Insufficient documentation

## 2018-06-27 DIAGNOSIS — O09529 Supervision of elderly multigravida, unspecified trimester: Secondary | ICD-10-CM | POA: Insufficient documentation

## 2018-06-27 DIAGNOSIS — O3110X0 Continuing pregnancy after spontaneous abortion of one fetus or more, unspecified trimester, not applicable or unspecified: Secondary | ICD-10-CM | POA: Insufficient documentation

## 2018-06-27 DIAGNOSIS — O99210 Obesity complicating pregnancy, unspecified trimester: Secondary | ICD-10-CM | POA: Insufficient documentation

## 2018-06-27 LAB — OB RESULTS CONSOLE RUBELLA ANTIBODY, IGM: Rubella: IMMUNE

## 2018-06-27 LAB — OB RESULTS CONSOLE HEPATITIS B SURFACE ANTIGEN: Hepatitis B Surface Ag: NEGATIVE

## 2018-10-17 LAB — OB RESULTS CONSOLE HIV ANTIBODY (ROUTINE TESTING): HIV: NONREACTIVE

## 2018-11-14 ENCOUNTER — Other Ambulatory Visit: Payer: Self-pay

## 2018-11-14 ENCOUNTER — Encounter (HOSPITAL_COMMUNITY): Payer: Self-pay | Admitting: *Deleted

## 2018-11-14 ENCOUNTER — Inpatient Hospital Stay (HOSPITAL_COMMUNITY)
Admission: AD | Admit: 2018-11-14 | Discharge: 2018-11-14 | Disposition: A | Discharge status: Home / Self Care | Payer: 59 | Attending: Obstetrics and Gynecology | Admitting: Obstetrics and Gynecology

## 2018-11-14 DIAGNOSIS — Z79899 Other long term (current) drug therapy: Secondary | ICD-10-CM | POA: Diagnosis not present

## 2018-11-14 DIAGNOSIS — O133 Gestational [pregnancy-induced] hypertension without significant proteinuria, third trimester: Secondary | ICD-10-CM | POA: Diagnosis not present

## 2018-11-14 DIAGNOSIS — Z880 Allergy status to penicillin: Secondary | ICD-10-CM | POA: Diagnosis not present

## 2018-11-14 DIAGNOSIS — O99513 Diseases of the respiratory system complicating pregnancy, third trimester: Secondary | ICD-10-CM | POA: Insufficient documentation

## 2018-11-14 DIAGNOSIS — Z87891 Personal history of nicotine dependence: Secondary | ICD-10-CM | POA: Insufficient documentation

## 2018-11-14 DIAGNOSIS — Z3689 Encounter for other specified antenatal screening: Secondary | ICD-10-CM | POA: Insufficient documentation

## 2018-11-14 DIAGNOSIS — Z3A31 31 weeks gestation of pregnancy: Secondary | ICD-10-CM | POA: Diagnosis not present

## 2018-11-14 DIAGNOSIS — J45909 Unspecified asthma, uncomplicated: Secondary | ICD-10-CM | POA: Insufficient documentation

## 2018-11-14 DIAGNOSIS — Z881 Allergy status to other antibiotic agents status: Secondary | ICD-10-CM | POA: Diagnosis not present

## 2018-11-14 HISTORY — DX: Unspecified asthma, uncomplicated: J45.909

## 2018-11-14 LAB — COMPREHENSIVE METABOLIC PANEL
ALT: 14 U/L (ref 0–44)
AST: 20 U/L (ref 15–41)
Albumin: 2.8 g/dL — ABNORMAL LOW (ref 3.5–5.0)
Alkaline Phosphatase: 102 U/L (ref 38–126)
Anion gap: 8 (ref 5–15)
BUN: 5 mg/dL — ABNORMAL LOW (ref 6–20)
CO2: 21 mmol/L — ABNORMAL LOW (ref 22–32)
Calcium: 8.9 mg/dL (ref 8.9–10.3)
Chloride: 107 mmol/L (ref 98–111)
Creatinine, Ser: 0.71 mg/dL (ref 0.44–1.00)
GFR calc Af Amer: >60 mL/min (ref >60)
GFR calc non Af Amer: >60 mL/min (ref >60)
Glucose, Bld: 74 mg/dL (ref 70–99)
Potassium: 3.7 mmol/L (ref 3.5–5.1)
Sodium: 136 mmol/L (ref 135–145)
Total Bilirubin: 0.5 mg/dL (ref 0.3–1.2)
Total Protein: 6.7 g/dL (ref 6.5–8.1)

## 2018-11-14 LAB — PROTEIN / CREATININE RATIO, URINE
Creatinine, Urine: 145.58 mg/dL
Protein Creatinine Ratio: 0.1 mg/mg{Cre} (ref 0.00–0.15)
Total Protein, Urine: 15 mg/dL

## 2018-11-14 LAB — CBC
HCT: 37.9 % (ref 36.0–46.0)
Hemoglobin: 12.1 g/dL (ref 12.0–15.0)
MCH: 28.2 pg (ref 26.0–34.0)
MCHC: 31.9 g/dL (ref 30.0–36.0)
MCV: 88.3 fL (ref 80.0–100.0)
Platelets: 230 10*3/uL (ref 150–400)
RBC: 4.29 MIL/uL (ref 3.87–5.11)
RDW: 15.3 % (ref 11.5–15.5)
WBC: 9.8 10*3/uL (ref 4.0–10.5)
nRBC: 0 % (ref 0.0–0.2)

## 2018-11-14 NOTE — Discharge Instructions (Signed)
Hypertension During Pregnancy °Hypertension is also called high blood pressure. High blood pressure means that the force of your blood moving in your body is too strong. It can cause problems for you and your baby. Different types of high blood pressure can happen during pregnancy. The types are: °· High blood pressure before you got pregnant. This is called chronic hypertension.  This can continue during your pregnancy. Your doctor will want to keep checking your blood pressure. You may need medicine to keep your blood pressure under control while you are pregnant. You will need follow-up visits after you have your baby. °· High blood pressure that goes up during pregnancy when it was normal before. This is called gestational hypertension. It will usually get better after you have your baby, but your doctor will need to watch your blood pressure to make sure that it is getting better. °· Very high blood pressure during pregnancy. This is called preeclampsia. Very high blood pressure is an emergency that needs to be checked and treated right away. °· You may develop very high blood pressure after giving birth. This is called postpartum preeclampsia. This usually occurs within 48 hours after childbirth but may occur up to 6 weeks after giving birth. This is rare. °How does this affect me? °If you have high blood pressure during pregnancy, you have a higher chance of developing high blood pressure: °· As you get older. °· If you get pregnant again. °In some cases, high blood pressure during pregnancy can cause: °· Stroke. °· Heart attack. °· Damage to the kidneys, lungs, or liver. °· Preeclampsia. °· Jerky movements you cannot control (convulsions or seizures). °· Problems with the placenta. °How does this affect my baby? °Your baby may: °· Be born early. °· Not weigh as much as he or she should. °· Not handle labor well, leading to a c-section birth. °What are the risks? °· Having high blood pressure during a past  pregnancy. °· Being overweight. °· Being 35 years old or older. °· Being pregnant for the first time. °· Being pregnant with more than one baby. °· Becoming pregnant using fertility methods, such as IVF. °· Having other problems, such as diabetes, or kidney disease. °· Having family members who have high blood pressure. °What can I do to lower my risk? ° °· Keep a healthy weight. °· Eat a healthy diet. °· Follow what your doctor tells you about treating any medical problems that you had before becoming pregnant. °It is very important to go to all of your doctor visits. Your doctor will check your blood pressure and make sure that your pregnancy is progressing as it should. Treatment should start early if a problem is found. °How is this treated? °Treatment for high blood pressure during pregnancy can differ depending on the type of high blood pressure you have and how serious it is. °· You may need to take blood pressure medicine. °· If you have been taking medicine for your blood pressure, you may need to change the medicine during pregnancy if it is not safe for your baby. °· If your doctor thinks that you could get very high blood pressure, he or she may tell you to take a low-dose aspirin during your pregnancy. °· If you have very high blood pressure, you may need to stay in the hospital so you and your baby can be watched closely. You may also need to take medicine to lower your blood pressure. This medicine may be given by mouth   or through an IV tube. °· In some cases, if your condition gets worse, you may need to have your baby early. °Follow these instructions at home: °Eating and drinking ° °· Drink enough fluid to keep your pee (urine) pale yellow. °· Avoid caffeine. °Lifestyle °· Do not use any products that contain nicotine or tobacco, such as cigarettes, e-cigarettes, and chewing tobacco. If you need help quitting, ask your doctor. °· Do not use alcohol or drugs. °· Avoid stress. °· Rest and get plenty  of sleep. °· Regular exercise can help. Ask your doctor what kinds of exercise are best for you. °General instructions °· Take over-the-counter and prescription medicines only as told by your doctor. °· Keep all prenatal and follow-up visits as told by your doctor. This is important. °Contact a doctor if: °· You have symptoms that your doctor told you to watch for, such as: °? Headaches. °? Nausea. °? Vomiting. °? Belly (abdominal) pain. °? Dizziness. °? Light-headedness. °Get help right away if: °· You have: °? Very bad belly pain that does not get better with treatment. °? A very bad headache that does not get better. °? Vomiting that does not get better. °? Sudden, fast weight gain. °? Sudden swelling in your hands, ankles, or face. °? Bleeding from your vagina. °? Blood in your pee. °? Blurry vision. °? Double vision. °? Shortness of breath. °? Chest pain. °? Weakness on one side of your body. °? Trouble talking. °· Your baby is not moving as much as usual. °Summary °· High blood pressure is also called hypertension. °· High blood pressure means that the force of your blood moving in your body is too strong. °· High blood pressure can cause problems for you and your baby. °· Keep all follow-up visits as told by your doctor. This is important. °This information is not intended to replace advice given to you by your health care provider. Make sure you discuss any questions you have with your health care provider. °Document Released: 01/23/2010 Document Revised: 04/13/2018 Document Reviewed: 01/17/2018 °Elsevier Patient Education © 2020 Elsevier Inc. ° °

## 2018-11-14 NOTE — MAU Note (Signed)
Pt sent over from office for HTN 150s-160s/90s.  Denies HA or blurred vision.  Reports some increased swelling.  Had painful CTX last night, but resolved.

## 2018-11-14 NOTE — MAU Provider Note (Signed)
History     CSN: [id number]  Arrival date and time: 11/14/18 1429   First Provider Initiated Contact with Patient 11/14/18 1525      Chief Complaint  Patient presents with  . Hypertension   HPI Briana Pham is a 33 y.o. G1P0 at [redacted]w[redacted]d who presents to MAU from clinic for evaluation of elevated blood pressures. She denies history of elevated blood pressures. She denies headache, RUQ/epigastric pain, new onset weight gain.  Visual Disturbances Patient endorses one month history of spots in her field of vision, but states they only occur when she changes position quickly or if she is feeling dehydrated.  Lower Extremity Swelling Patient endorses worsening lower extremity swelling. She has purchased compression socks but has difficulty wearing them consistently because she cannot put them on herself.  She receives prenatal care with [facility]  OB History    Gravida  1   Para      Term      Preterm      AB      Living        SAB      TAB      Ectopic      Multiple      Live Births              Past Medical History:  Diagnosis Date  . Asthma   . Insomnia     Past Surgical History:  Procedure Laterality Date  . ivf    . MOUTH SURGERY    . WISDOM TOOTH EXTRACTION      No family history on file.  Social History   Tobacco Use  . Smoking status: Former Research scientist (life sciences)  . Smokeless tobacco: Never Used  Substance Use Topics  . Alcohol use: No  . Drug use: No    Allergies:  Allergies  Allergen Reactions  . Zithromax [Azithromycin] Anaphylaxis    Rash, crawly skin, could not form sentences  . Penicillins Hives    Confusion    Medications Prior to Admission  Medication Sig Dispense Refill Last Dose  . albuterol (PROVENTIL) (2.5 MG/3ML) 0.083% nebulizer solution Take 2.5 mg by nebulization as needed for wheezing or shortness of breath.     . ALPRAZolam (XANAX) 0.5 MG tablet Take 0.5 mg by mouth at bedtime as needed.      Marland Kitchen EPINEPHrine 0.3  mg/0.3 mL IJ SOAJ injection See admin instructions.  1   . JUNEL FE 1.5/30 1.5-30 MG-MCG tablet      . Lorcaserin HCl (BELVIQ) 10 MG TABS Take 10 mg by mouth.     . QUEtiapine (SEROQUEL) 100 MG tablet TAKE 1 TABLET (100 MG TOTAL) BY MOUTH AT BEDTIME. 90 tablet 3     Review of Systems  Constitutional: Negative for chills, fatigue and fever.  Eyes: Negative for visual disturbance.  Gastrointestinal: Negative for abdominal pain.  Genitourinary: Negative for difficulty urinating, vaginal bleeding, vaginal discharge and vaginal pain.  Musculoskeletal: Negative for back pain.  Neurological: Negative for syncope, weakness and headaches.  All other systems reviewed and are negative.  Physical Exam   Blood pressure 126/80, pulse 81, weight 109.7 kg, SpO2 97 %.  Physical Exam  Nursing note and vitals reviewed. Constitutional: She is oriented to person, place, and time. She appears well-developed and well-nourished.  Cardiovascular: Normal rate.  Respiratory: Effort normal and breath sounds normal. She has no decreased breath sounds.  GI: Soft. She exhibits no distension. There is no abdominal tenderness. There is no  rebound, no guarding and no CVA tenderness.  Gravid  Neurological: She is alert and oriented to person, place, and time.  Skin: Skin is warm and dry.  Psychiatric: She has a normal mood and affect. Her behavior is normal. Judgment and thought content normal.    MAU Course/MDM  Procedures  --Dr. Ellyn Hack made contact with MAU Provider prior to patient arrival to discuss plan of care and to confirm she would like to be primary point of contact for patient --Called Dr. Ellyn Hack at 1710 to discuss vitals, confirm PEC labs WNL. Per Dr. Ellyn Hack, patient meets criteria for Gestational Hypertension. Plan for follow-up in clinic next week --Reactive tracing: baseline 140, moderate variability, positive accels, no decels.  --Toco: quiet  Patient Vitals for the past 24 hrs:  BP Pulse Resp  SpO2 Weight  11/14/18 1725 - - 18 - -  11/14/18 1700 135/84 84 18 - -  11/14/18 1645 133/81 78 - - -  11/14/18 1630 133/86 88 - 98 % -  11/14/18 1615 126/80 81 - 97 % -  11/14/18 1600 (!) 145/89 88 - 97 % -  11/14/18 1545 127/84 77 - 97 % -  11/14/18 1534 135/87 77 - - -  11/14/18 1515 (!) 154/91 74 - 99 % -  11/14/18 1510 (!) 150/96 79 - 100 % -  11/14/18 1459 - - - - 109.7 kg   Results for orders placed or performed during the hospital encounter of 11/14/18 (from the past 24 hour(s))  CBC     Status: None   Collection Time: 11/14/18  3:02 PM  Result Value Ref Range   WBC 9.8 4.0 - 10.5 K/uL   RBC 4.29 3.87 - 5.11 MIL/uL   Hemoglobin 12.1 12.0 - 15.0 g/dL   HCT 53.6 64.4 - 03.4 %   MCV 88.3 80.0 - 100.0 fL   MCH 28.2 26.0 - 34.0 pg   MCHC 31.9 30.0 - 36.0 g/dL   RDW 74.2 59.5 - 63.8 %   Platelets 230 150 - 400 K/uL   nRBC 0.0 0.0 - 0.2 %  Comprehensive metabolic panel     Status: Abnormal   Collection Time: 11/14/18  3:02 PM  Result Value Ref Range   Sodium 136 135 - 145 mmol/L   Potassium 3.7 3.5 - 5.1 mmol/L   Chloride 107 98 - 111 mmol/L   CO2 21 (L) 22 - 32 mmol/L   Glucose, Bld 74 70 - 99 mg/dL   BUN 5 (L) 6 - 20 mg/dL   Creatinine, Ser 7.56 0.44 - 1.00 mg/dL   Calcium 8.9 8.9 - 43.3 mg/dL   Total Protein 6.7 6.5 - 8.1 g/dL   Albumin 2.8 (L) 3.5 - 5.0 g/dL   AST 20 15 - 41 U/L   ALT 14 0 - 44 U/L   Alkaline Phosphatase 102 38 - 126 U/L   Total Bilirubin 0.5 0.3 - 1.2 mg/dL   GFR calc non Af Amer >60 >60 mL/min   GFR calc Af Amer >60 >60 mL/min   Anion gap 8 5 - 15  Protein / creatinine ratio, urine     Status: None   Collection Time: 11/14/18  3:50 PM  Result Value Ref Range   Creatinine, Urine 145.58 mg/dL   Total Protein, Urine 15 mg/dL   Protein Creatinine Ratio 0.10 0.00 - 0.15 mg/mg[Cre]   Assessment and Plan  --33 y.o. G1P0 at [redacted]w[redacted]d  --Reactive tracing, closed cervix --Gestational Hypertension, PEC labs WNL --Discharge home in  stable condition  with Preeclampsia precautions  F/U: Per Dr. Ellyn HackBovard, schedule in clinic next week  Calvert CantorSamantha C Ethyl Vila, CNM 11/14/2018, 6:35 PM

## 2018-12-06 ENCOUNTER — Observation Stay (EMERGENCY_DEPARTMENT_HOSPITAL)
Admission: AD | Admit: 2018-12-06 | Discharge: 2018-12-07 | Disposition: A | Discharge status: Home / Self Care | Payer: 59 | Source: Home / Self Care | Attending: Obstetrics and Gynecology | Admitting: Obstetrics and Gynecology

## 2018-12-06 ENCOUNTER — Other Ambulatory Visit: Payer: Self-pay

## 2018-12-06 ENCOUNTER — Encounter (HOSPITAL_COMMUNITY): Payer: Self-pay

## 2018-12-06 DIAGNOSIS — O1413 Severe pre-eclampsia, third trimester: Secondary | ICD-10-CM | POA: Diagnosis not present

## 2018-12-06 DIAGNOSIS — O1414 Severe pre-eclampsia complicating childbirth: Secondary | ICD-10-CM | POA: Diagnosis not present

## 2018-12-06 DIAGNOSIS — Z3A34 34 weeks gestation of pregnancy: Secondary | ICD-10-CM | POA: Insufficient documentation

## 2018-12-06 DIAGNOSIS — O1493 Unspecified pre-eclampsia, third trimester: Secondary | ICD-10-CM | POA: Diagnosis present

## 2018-12-06 DIAGNOSIS — Z20828 Contact with and (suspected) exposure to other viral communicable diseases: Secondary | ICD-10-CM | POA: Insufficient documentation

## 2018-12-06 DIAGNOSIS — J45909 Unspecified asthma, uncomplicated: Secondary | ICD-10-CM | POA: Insufficient documentation

## 2018-12-06 DIAGNOSIS — O1494 Unspecified pre-eclampsia, complicating childbirth: Secondary | ICD-10-CM | POA: Diagnosis not present

## 2018-12-06 LAB — COMPREHENSIVE METABOLIC PANEL
ALT: 27 U/L (ref 0–44)
AST: 37 U/L (ref 15–41)
Albumin: 2.6 g/dL — ABNORMAL LOW (ref 3.5–5.0)
Alkaline Phosphatase: 119 U/L (ref 38–126)
Anion gap: 9 (ref 5–15)
BUN: <5 mg/dL — ABNORMAL LOW (ref 6–20)
CO2: 21 mmol/L — ABNORMAL LOW (ref 22–32)
Calcium: 9.4 mg/dL (ref 8.9–10.3)
Chloride: 106 mmol/L (ref 98–111)
Creatinine, Ser: 0.75 mg/dL (ref 0.44–1.00)
GFR calc Af Amer: >60 mL/min (ref >60)
GFR calc non Af Amer: >60 mL/min (ref >60)
Glucose, Bld: 91 mg/dL (ref 70–99)
Potassium: 4.3 mmol/L (ref 3.5–5.1)
Sodium: 136 mmol/L (ref 135–145)
Total Bilirubin: 0.3 mg/dL (ref 0.3–1.2)
Total Protein: 6.3 g/dL — ABNORMAL LOW (ref 6.5–8.1)

## 2018-12-06 LAB — PROTEIN / CREATININE RATIO, URINE
Creatinine, Urine: 308.26 mg/dL
Protein Creatinine Ratio: 1 mg/mg{Cre} — ABNORMAL HIGH (ref 0.00–0.15)
Total Protein, Urine: 309 mg/dL

## 2018-12-06 LAB — CBC
HCT: 36.7 % (ref 36.0–46.0)
Hemoglobin: 12 g/dL (ref 12.0–15.0)
MCH: 28.6 pg (ref 26.0–34.0)
MCHC: 32.7 g/dL (ref 30.0–36.0)
MCV: 87.4 fL (ref 80.0–100.0)
Platelets: 193 10*3/uL (ref 150–400)
RBC: 4.2 MIL/uL (ref 3.87–5.11)
RDW: 15.4 % (ref 11.5–15.5)
WBC: 9.8 10*3/uL (ref 4.0–10.5)
nRBC: 0 % (ref 0.0–0.2)

## 2018-12-06 LAB — ABO/RH: ABO/RH(D): O POS

## 2018-12-06 LAB — SARS CORONAVIRUS 2 (TAT 6-24 HRS): SARS Coronavirus 2: NEGATIVE

## 2018-12-06 MED ORDER — CALCIUM CARBONATE ANTACID 500 MG PO CHEW
2.0000 | CHEWABLE_TABLET | ORAL | Status: DC | PRN
  Administered 2018-12-07: 400 mg via ORAL
  Filled 2018-12-06: qty 2

## 2018-12-06 MED ORDER — ACETAMINOPHEN 325 MG PO TABS
650.0000 mg | ORAL_TABLET | ORAL | Status: DC | PRN
  Administered 2018-12-06: 650 mg via ORAL
  Filled 2018-12-06: qty 2

## 2018-12-06 MED ORDER — LABETALOL HCL 5 MG/ML IV SOLN
40.0000 mg | INTRAVENOUS | Status: DC | PRN
  Administered 2018-12-06: 40 mg via INTRAVENOUS

## 2018-12-06 MED ORDER — HYDRALAZINE HCL 20 MG/ML IJ SOLN: 10.0000 mg | INTRAMUSCULAR | Status: DC | PRN

## 2018-12-06 MED ORDER — LABETALOL HCL 5 MG/ML IV SOLN
40.0000 mg | INTRAVENOUS | Status: DC | PRN
  Filled 2018-12-06 (×2): qty 8

## 2018-12-06 MED ORDER — LABETALOL HCL 5 MG/ML IV SOLN: 20.0000 mg | INTRAVENOUS | Status: DC | PRN

## 2018-12-06 MED ORDER — LACTATED RINGERS IV SOLN
INTRAVENOUS | Status: DC
  Administered 2018-12-06 – 2018-12-07 (×4): via INTRAVENOUS

## 2018-12-06 MED ORDER — PRENATAL MULTIVITAMIN CH
1.0000 | ORAL_TABLET | Freq: Every day | ORAL | Status: DC
  Administered 2018-12-07: 1 via ORAL
  Filled 2018-12-06: qty 1

## 2018-12-06 MED ORDER — LABETALOL HCL 5 MG/ML IV SOLN
80.0000 mg | INTRAVENOUS | Status: DC | PRN
  Filled 2018-12-06: qty 16

## 2018-12-06 MED ORDER — BETAMETHASONE SOD PHOS & ACET 6 (3-3) MG/ML IJ SUSP
12.0000 mg | Freq: Once | INTRAMUSCULAR | Status: AC
  Administered 2018-12-06: 15:00:00 12 mg via INTRAMUSCULAR
  Filled 2018-12-06: qty 5

## 2018-12-06 MED ORDER — ZOLPIDEM TARTRATE 5 MG PO TABS
5.0000 mg | ORAL_TABLET | Freq: Every evening | ORAL | Status: DC | PRN
  Administered 2018-12-06: 5 mg via ORAL
  Filled 2018-12-06: qty 1

## 2018-12-06 MED ORDER — BETAMETHASONE SOD PHOS & ACET 6 (3-3) MG/ML IJ SUSP
12.0000 mg | Freq: Once | INTRAMUSCULAR | Status: AC
  Administered 2018-12-07: 12 mg via INTRAMUSCULAR

## 2018-12-06 MED ORDER — SODIUM CHLORIDE 0.9% FLUSH: 3.0000 mL | Freq: Two times a day (BID) | INTRAVENOUS | Status: DC

## 2018-12-06 MED ORDER — LABETALOL HCL 5 MG/ML IV SOLN
INTRAVENOUS | Status: AC
  Administered 2018-12-06: 20 mg via INTRAVENOUS
  Filled 2018-12-06: qty 4

## 2018-12-06 MED ORDER — SODIUM CHLORIDE 0.9% FLUSH: 3.0000 mL | INTRAVENOUS | Status: DC | PRN

## 2018-12-06 MED ORDER — LABETALOL HCL 5 MG/ML IV SOLN
80.0000 mg | INTRAVENOUS | Status: DC | PRN
  Administered 2018-12-06: 80 mg via INTRAVENOUS

## 2018-12-06 MED ORDER — DOCUSATE SODIUM 100 MG PO CAPS
100.0000 mg | ORAL_CAPSULE | Freq: Every day | ORAL | Status: DC
  Administered 2018-12-07: 100 mg via ORAL
  Filled 2018-12-06: qty 1

## 2018-12-06 MED ORDER — LABETALOL HCL 200 MG PO TABS
200.0000 mg | ORAL_TABLET | Freq: Two times a day (BID) | ORAL | Status: DC
  Administered 2018-12-06 – 2018-12-07 (×3): 200 mg via ORAL
  Filled 2018-12-06 (×3): qty 1

## 2018-12-06 MED ORDER — SODIUM CHLORIDE 0.9 % IV SOLN: 250.0000 mL | INTRAVENOUS | Status: DC | PRN

## 2018-12-06 NOTE — Progress Notes (Signed)
Dr. Willis Modena notified of severe ranged BP's. Received verbal order to go ahead and give scheduled 200mg  oral labetalol and 40 mg IV labetalol. Will continue to monitor.

## 2018-12-06 NOTE — MAU Provider Note (Signed)
History     CSN: [id number]  Arrival date and time: 12/06/18 1310   First Provider Initiated Contact with Patient 12/06/18 1402      Chief Complaint  Patient presents with  . Hypertension   HPI    [name] is a 33 y.o. female G1P0 @ [redacted]w[redacted]d with a history of gestational HTN, here from the office with elevated BP, some severe range. States she has had elevated BP since November. She has had no Ha or upper abdominal pain. Today while waiting in the lobby she had one episode of vision changes where she saw spots. It was brief and has not occurred again. + fetal movement.   OB History    Gravida  1   Para      Term      Preterm      AB      Living        SAB      TAB      Ectopic      Multiple      Live Births              Past Medical History:  Diagnosis Date  . Asthma   . Insomnia     Past Surgical History:  Procedure Laterality Date  . HYSTEROSCOPY    . ivf    . MOUTH SURGERY    . WISDOM TOOTH EXTRACTION      History reviewed. No pertinent family history.  Social History   Tobacco Use  . Smoking status: Former Research scientist (life sciences)  . Smokeless tobacco: Never Used  Substance Use Topics  . Alcohol use: No  . Drug use: No    Allergies:  Allergies  Allergen Reactions  . Zithromax [Azithromycin] Anaphylaxis    Rash, crawly skin, could not form sentences  . Penicillins Hives    Confusion    Medications Prior to Admission  Medication Sig Dispense Refill Last Dose  . albuterol (PROVENTIL) (2.5 MG/3ML) 0.083% nebulizer solution Take 2.5 mg by nebulization as needed for wheezing or shortness of breath.      Results for orders placed or performed during the hospital encounter of 12/06/18 (from the past 48 hour(s))  CBC     Status: None   Collection Time: 12/06/18  1:54 PM  Result Value Ref Range   WBC 9.8 4.0 - 10.5 K/uL   RBC 4.20 3.87 - 5.11 MIL/uL   Hemoglobin 12.0 12.0 - 15.0 g/dL   HCT 36.7 36.0 - 46.0 %   MCV 87.4 80.0 - 100.0 fL    MCH 28.6 26.0 - 34.0 pg   MCHC 32.7 30.0 - 36.0 g/dL   RDW 15.4 11.5 - 15.5 %   Platelets 193 150 - 400 K/uL    Comment: REPEATED TO VERIFY   nRBC 0.0 0.0 - 0.2 %    Comment: Performed at [facility], [place] [address], [place], [place] [address]  Comprehensive metabolic panel     Status: Abnormal   Collection Time: 12/06/18  1:54 PM  Result Value Ref Range   Sodium 136 135 - 145 mmol/L   Potassium 4.3 3.5 - 5.1 mmol/L   Chloride 106 98 - 111 mmol/L   CO2 21 (L) 22 - 32 mmol/L   Glucose, Bld 91 70 - 99 mg/dL   BUN <5 (L) 6 - 20 mg/dL   Creatinine, Ser 0.75 0.44 - 1.00 mg/dL   Calcium 9.4 8.9 - 10.3 mg/dL   Total Protein 6.3 (  L) 6.5 - 8.1 g/dL   Albumin 2.6 (L) 3.5 - 5.0 g/dL   AST 37 15 - 41 U/L   ALT 27 0 - 44 U/L   Alkaline Phosphatase 119 38 - 126 U/L   Total Bilirubin 0.3 0.3 - 1.2 mg/dL   GFR calc non Af Amer >60 >60 mL/min   GFR calc Af Amer >60 >60 mL/min   Anion gap 9 5 - 15    Comment: Performed at [facility], [address], [place], [place] [address]  Protein / creatinine ratio, urine     Status: Abnormal   Collection Time: 12/06/18  1:54 PM  Result Value Ref Range   Creatinine, Urine 308.26 mg/dL   Total Protein, Urine 309 mg/dL    Comment: NO NORMAL RANGE ESTABLISHED FOR THIS TEST RESULTS CONFIRMED BY MANUAL DILUTION    Protein Creatinine Ratio 1.00 (H) 0.00 - 0.15 mg/mg[Cre]    Comment: Performed at [facility], [address], [place], [place] [address]  Type and screen [facility]     Status: None   Collection Time: 12/06/18  1:55 PM  Result Value Ref Range   ABO/RH(D) O POS    Antibody Screen NEG    Sample Expiration      12/09/2018,2359 Performed at [facility], [address], [place], [place] [address]   ABO/Rh     Status: None   Collection Time: 12/06/18  1:55 PM  Result Value Ref Range   ABO/RH(D)      O POS Performed at [facility], [address] [place], [place] [address]    Review of  Systems  Eyes: Negative for photophobia and visual disturbance.  Gastrointestinal: Negative for abdominal pain.  Neurological: Negative for headaches.   Physical Exam   Blood pressure (!) 168/95, pulse 73, temperature 98.9 F (37.2 C), temperature source Oral, resp. rate 18, SpO2 99 %.   Patient Vitals for the past 24 hrs:  BP Temp Temp src Pulse Resp SpO2 Height Weight  12/06/18 1918 (!) 147/87 - - 81 - - - -  12/06/18 1804 (!) 159/93 - - 84 - - - -  12/06/18 1752 (!) 171/100 - - 80 - 98 % - -  12/06/18 1735 (!) 176/104 - - 80 - 99 % - -  12/06/18 1725 (!) 174/100 - - 83 - 98 % 5\' 7"  (1.702 m) 112.9 kg  12/06/18 1700 (!) 157/94 - - 79 - - - -  12/06/18 1646 (!) 170/106 - - 86 - - - -  12/06/18 1630 (!) 168/95 - - 73 - - - -  12/06/18 1615 (!) 158/92 - - 71 - - - -  12/06/18 1600 (!) 156/92 - - 74 - - - -  12/06/18 1545 (!) 161/95 - - 77 - - - -  12/06/18 1530 (!) 151/103 - - 83 - 99 % - -  12/06/18 1515 (!) 147/93 - - 77 - 98 % - -  12/06/18 1500 (!) 156/96 - - 76 - 99 % - -  12/06/18 1445 (!) 153/100 - - 78 - 99 % - -  12/06/18 1430 (!) 150/91 - - 82 - 99 % - -  12/06/18 1418 (!) 158/90 - - 73 - - - -  12/06/18 1400 (!) 154/91 - - 80 - 99 % - -  12/06/18 1345 (!) 155/94 98.9 F (37.2 C) Oral 86 18 100 % - -  Physical Exam  Constitutional: She is oriented to person, place, and time. She appears well-developed and well-nourished. No distress.  HENT:  Head: Normocephalic.  Cardiovascular: Normal rate.  Respiratory: Effort normal and breath sounds normal.  Musculoskeletal: Normal range of motion.     Comments: Negative clonus   Neurological: She is alert and oriented to person, place, and time. She has normal reflexes. She displays normal reflexes.  Skin: Skin is warm. She is not diaphoretic.  Psychiatric: Her behavior is normal.   Fetal Tracing: Baseline: 130 bpm Variability: Moderate  Accelerations: 15x15 Decelerations: None Toco: None  MAU Course  Procedures    Pt informed that the ultrasound is considered a limited OB ultrasound and is not intended to be a complete ultrasound exam.  Patient also informed that the ultrasound is not being completed with the intent of assessing for fetal or placental anomalies or any pelvic abnormalities.  Explained that the purpose of today's ultrasound is to assess for  presentation.  Patient acknowledges the purpose of the exam and the limitations of the study.  Vertex presentation.   MDM  PIH labs collected No further visual changes while in MAU Severe range BP's: labetalol protocol initiated.  Discussed labs and PCR with Dr. Jackelyn KnifeMeisinger. Discussed and recommend admission. Dr. Jackelyn KnifeMeisinger agreeable with plan.  BMZ given.  Assessment and Plan   A:  1. Severe preeclampsia, third trimester   2. [redacted] weeks gestation of pregnancy     P[facility]  [facility]    Rasch, Harolyn RutherfordJennifer I, NP 12/06/2018 7:36 PM

## 2018-12-06 NOTE — MAU Note (Signed)
Pt presents to MAU from OB office due to severe range blood pressures. Pt is reporting visual changes but denies HA, epigastric pain and increased swelling.

## 2018-12-06 NOTE — MAU Note (Signed)
Sent from office, severe range BP, protein in urine.

## 2018-12-06 NOTE — H&P (Signed)
Briana Pham is a 33 y.o. female, G1P0, EGA [redacted]W[redacted]D with [place] 1-10 presenting for elevated BP at office.  She has had mildly elevated BP off and on, normal eval in MAU 2 weeks ago.  Today, BP in office 160-170/100, no PIH sx.  Prenatal care complicated by IVF conception with vanishing twin, AMA with low risk MaterniT21, obesity.  OB History    Gravida  1   Para      Term      Preterm      AB      Living        SAB      TAB      Ectopic      Multiple      Live Births             Past Medical History:  Diagnosis Date  . Asthma   . Insomnia    Past Surgical History:  Procedure Laterality Date  . HYSTEROSCOPY    . ivf    . MOUTH SURGERY    . WISDOM TOOTH EXTRACTION     Family History: family history is not on file. Social History:  reports that she has quit smoking. She has never used smokeless tobacco. She reports that she does not drink alcohol or use drugs.     Maternal Diabetes: No Genetic Screening: Normal Maternal Ultrasounds/Referrals: Normal Fetal Ultrasounds or other Referrals:  None Maternal Substance Abuse:  No Significant Maternal Medications:  None Significant Maternal Lab Results:  None Other Comments:  None  Review of Systems  Respiratory: Negative.   Cardiovascular: Negative.    Maternal Medical History:  Fetal activity: Perceived fetal activity is normal.    Prenatal Complications - Diabetes: none.      Blood pressure (!) 157/94, pulse 79, temperature 98.9 F (37.2 C), temperature source Oral, resp. rate 18, SpO2 99 %. Maternal Exam:  Abdomen: Patient reports no abdominal tenderness. Introitus: Normal vulva. Normal vagina.  Amniotic fluid character: not assessed.     Fetal Exam Fetal Monitor Review: Mode: ultrasound.   Variability: moderate (6-25 bpm).   Pattern: accelerations present and no decelerations.    Fetal State Assessment: Category I - tracings are normal.     Physical Exam  Vitals  reviewed. Constitutional: She appears well-developed and well-nourished.  Cardiovascular: Normal rate and regular rhythm.  Respiratory: Effort normal. No respiratory distress.  GI: Soft.  Genitourinary:    Vulva normal.     Prenatal labs: ABO, Rh:  O pos Antibody:  neg Rubella:  immune RPR:  NR  HBsAg:   neg HIV:   NR  Assessment/Plan: IUP at [redacted]W[redacted]D with preeclampsia with some severe range BP in MAU requiring IV Labetalol.  Labs ok except UPC now 1.0, was 0.1 on 11-10.  Has already received 1st dose of betamethasone in MAU.  Will admit, start Labetalol 200 mg po bid, IV labetalol prn, monitor BP, 2nd betamethasone tomorrow.  Discussed possible need for delivery if BP remains difficult to control.   Briana Pham 12/06/2018, 5:16 PM

## 2018-12-07 ENCOUNTER — Observation Stay (HOSPITAL_BASED_OUTPATIENT_CLINIC_OR_DEPARTMENT_OTHER): Payer: 59

## 2018-12-07 DIAGNOSIS — O1493 Unspecified pre-eclampsia, third trimester: Secondary | ICD-10-CM | POA: Diagnosis not present

## 2018-12-07 DIAGNOSIS — Z3A34 34 weeks gestation of pregnancy: Secondary | ICD-10-CM | POA: Diagnosis not present

## 2018-12-07 MED ORDER — LABETALOL HCL 200 MG PO TABS: 200.0000 mg | ORAL_TABLET | Freq: Two times a day (BID) | ORAL | 1 refills | Status: DC

## 2018-12-07 NOTE — Progress Notes (Signed)
Discharge instructions and prescriptions given to pt. Discussed pre-eclampsia, signs and symptoms to report to the MD, upcoming appointments, and meds. Pt verbalizes understanding and has no questions or concerns at this time.

## 2018-12-07 NOTE — Discharge Summary (Signed)
Physician Discharge Summary  Patient ID: Briana Pham MRN: [id number] DOB/AGE: 08-16-1985 33 y.o.  Admit date: 12/06/2018 Discharge date: 12/07/2018  Admission Diagnoses: Preeclampsia without severe features Pregnancy at 35 3/7 weeks Discharge Diagnoses:  Active Problems:   Preeclampsia, third trimester Preeclampsia without severe features  Discharged Condition: good  Hospital Course: Pt was admitted to observation for elevated blood pressures and proteinuria c/w preeclampsia.  She received betamethasone x 2 and blood pressure quickly responded to 200 mg po BID of labetalol.  She was asymptomatic and the FHR was category one.  MFM US revealed normal growth at the 25%ile, nml AFI and vertex.  The patient requested outpatient management and blood pressures 12 hours prior to discharge were 130-140/70-80 on her labetalol.   Consults: MFM   Discharge Exam: Blood pressure (!) 143/83, pulse 89, temperature 99.1 F (37.3 C), temperature source Oral, resp. rate 18, height 5\' 7"  (1.702 m), weight 112.9 kg, SpO2 97 %. General appearance: alert and cooperative  Abdomen gravid NT  Disposition: Discharge disposition: 01-Home or Self Care       Discharge Instructions    Activity as tolerated   Complete by: As directed    But no strenuous activity   Diet - low sodium heart healthy   Complete by: As directed    Discharge instructions   Complete by: As directed    You MUST call if experience headache, unusual abdominal pain, decreased fetal movement or bleeding.     Allergies as of 12/07/2018      Reactions   Zithromax [azithromycin] Anaphylaxis   Rash, crawly skin, could not form sentences   Penicillins Hives   Confusion      Medication List    TAKE these medications   albuterol (2.5 MG/3ML) 0.083% nebulizer solution Commonly known as: PROVENTIL Take 2.5 mg by nebulization as needed for wheezing or shortness of breath.   labetalol 200 MG tablet Commonly known as:  NORMODYNE Take 1 tablet (200 mg total) by mouth 2 (two) times daily.   prenatal multivitamin Tabs tablet Take 1 tablet by mouth daily at 12 noon.      Follow-up Information    Bovard-Stuckert, Jody, MD. Schedule an appointment as soon as possible for a visit in 4 day(s).   Specialty: Obstetrics and Gynecology Why: Monday AM 12/11/18 Contact information: [place] [address] [phone]           Signed: Logan Bores 12/07/2018, 5:46 PM

## 2018-12-07 NOTE — Progress Notes (Signed)
Patient ID: Briana Pham, female   DOB: 01/22/1985, 33 y.o.   MRN: [id number] HD #2 [id number] 4/7 weeks preeclampsia  Pt denies HA or PIH sx, did not sleep well, but states not unusual for her  BP since initiating po labetalol 200mg  po BID imporved to 130-140-/70-80 FHR category 1 last PM, not yet monitored today  Fundus NT  Pt with preeclampsia by elevated proteinuria and BP on admission, no severe features currently MFM [place] today for EFW r/o growth restriction S/p betamethasone x 1, second dose this pm at 1500 D/w pt will observe BP today and not sure yet if candidate for outpatient management given some severe range pressures yesterday

## 2018-12-07 NOTE — Discharge Instructions (Signed)
Preeclampsia and Eclampsia °Preeclampsia is a serious condition that may develop during pregnancy. This condition causes high blood pressure and increased protein in your urine along with other symptoms, such as headaches and vision changes. These symptoms may develop as the condition gets worse. Preeclampsia may occur at 20 weeks of pregnancy or later. °Diagnosing and treating preeclampsia early is very important. If not treated early, it can cause serious problems for you and your baby. One problem it can lead to is eclampsia. Eclampsia is a condition that causes muscle jerking or shaking (convulsions or seizures) and other serious problems for the mother. During pregnancy, delivering your baby may be the best treatment for preeclampsia or eclampsia. For most women, preeclampsia and eclampsia symptoms go away after giving birth. °In rare cases, a woman may develop preeclampsia after giving birth (postpartum preeclampsia). This usually occurs within 48 hours after childbirth but may occur up to 6 weeks after giving birth. °What are the causes? °The cause of preeclampsia is not known. °What increases the risk? °The following risk factors make you more likely to develop preeclampsia: °· Being pregnant for the first time. °· Having had preeclampsia during a past pregnancy. °· Having a family history of preeclampsia. °· Having high blood pressure. °· Being pregnant with more than one baby. °· Being 35 or older. °· Being African-American. °· Having kidney disease or diabetes. °· Having medical conditions such as lupus or blood diseases. °· Being very overweight (obese). °What are the signs or symptoms? °The most common symptoms are: °· Severe headaches. °· Vision problems, such as blurred or double vision. °· Abdominal pain, especially upper abdominal pain. °Other symptoms that may develop as the condition gets worse include: °· Sudden weight gain. °· Sudden swelling of the hands, face, legs, and feet. °· Severe nausea  and vomiting. °· Numbness in the face, arms, legs, and feet. °· Dizziness. °· Urinating less than usual. °· Slurred speech. °· Convulsions or seizures. °How is this diagnosed? °There are no screening tests for preeclampsia. Your health care provider will ask you about symptoms and check for signs of preeclampsia during your prenatal visits. You may also have tests that include: °· Checking your blood pressure. °· Urine tests to check for protein. Your health care provider will check for this at every prenatal visit. °· Blood tests. °· Monitoring your baby's heart rate. °· Ultrasound. °How is this treated? °You and your health care provider will determine the treatment approach that is best for you. Treatment may include: °· Having more frequent prenatal exams to check for signs of preeclampsia, if you have an increased risk for preeclampsia. °· Medicine to lower your blood pressure. °· Staying in the hospital, if your condition is severe. There, treatment will focus on controlling your blood pressure and the amount of fluids in your body (fluid retention). °· Taking medicine (magnesium sulfate) to prevent seizures. This may be given as an injection or through an IV. °· Taking a low-dose aspirin during your pregnancy. °· Delivering your baby early. You may have your labor started with medicine (induced), or you may have a cesarean delivery. °Follow these instructions at home: °Eating and drinking ° °· Drink enough fluid to keep your urine pale yellow. °· Avoid caffeine. °Lifestyle °· Do not use any products that contain nicotine or tobacco, such as cigarettes and e-cigarettes. If you need help quitting, ask your health care provider. °· Do not use alcohol or drugs. °· Avoid stress as much as possible. Rest and get   plenty of sleep. °General instructions °· Take over-the-counter and prescription medicines only as told by your health care provider. °· When lying down, lie on your left side. This keeps pressure off your  major blood vessels. °· When sitting or lying down, raise (elevate) your feet. Try putting some pillows underneath your lower legs. °· Exercise regularly. Ask your health care provider what kinds of exercise are best for you. °· Keep all follow-up and prenatal visits as told by your health care provider. This is important. °How is this prevented? °There is no known way of preventing preeclampsia or eclampsia from developing. However, to lower your risk of complications and detect problems early: °· Get regular prenatal care. Your health care provider may be able to diagnose and treat the condition early. °· Maintain a healthy weight. Ask your health care provider for help managing weight gain during pregnancy. °· Work with your health care provider to manage any long-term (chronic) health conditions you have, such as diabetes or kidney problems. °· You may have tests of your blood pressure and kidney function after giving birth. °· Your health care provider may have you take low-dose aspirin during your next pregnancy. °Contact a health care provider if: °· You have symptoms that your health care provider told you may require more treatment or monitoring, such as: °? Headaches. °? Nausea or vomiting. °? Abdominal pain. °? Dizziness. °? Light-headedness. °Get help right away if: °· You have severe: °? Abdominal pain. °? Headaches that do not get better. °? Dizziness. °? Vision problems. °? Confusion. °? Nausea or vomiting. °· You have any of the following: °? A seizure. °? Sudden, rapid weight gain. °? Sudden swelling in your hands, ankles, or face. °? Trouble moving any part of your body. °? Numbness in any part of your body. °? Trouble speaking. °? Abnormal bleeding. °· You faint. °Summary °· Preeclampsia is a serious condition that may develop during pregnancy. °· This condition causes high blood pressure and increased protein in your urine along with other symptoms, such as headaches and vision  changes. °· Diagnosing and treating preeclampsia early is very important. If not treated early, it can cause serious problems for you and your baby. °· Get help right away if you have symptoms that your health care provider told you to watch for. °This information is not intended to replace advice given to you by your health care provider. Make sure you discuss any questions you have with your health care provider. °Document Released: 12/19/1999 Document Revised: 08/23/2017 Document Reviewed: 07/28/2015 °Elsevier Patient Education © 2020 [employer] ° °

## 2018-12-07 NOTE — Progress Notes (Signed)
Patient ID: Briana Pham, female   DOB: 06-22-1985, 33 y.o.   MRN: [id number] 4/7 weeks preeclampsia  Pt has had a good day.  No HA and no c/o.  Good FM and FHR category one.  She would like to try outpatient management  BP stable all day 130-148/70's on Labetalol 200mg  po BID  MFM [place] today with normal growth at 25%ile and nml AFI, vtx  Reviewed preeclamptic precautions in detail and instructed patient she MUST call if has HA, decreased FM, or feels any new symptoms.  We discussed risk of seizures if disease progresses.   S/p betamethasone x 2  Will have return to office Monday 12/11/18 for labs, NST and BP check

## 2018-12-08 ENCOUNTER — Other Ambulatory Visit: Payer: Self-pay

## 2018-12-08 ENCOUNTER — Encounter (HOSPITAL_COMMUNITY): Payer: Self-pay | Admitting: *Deleted

## 2018-12-08 ENCOUNTER — Inpatient Hospital Stay (HOSPITAL_COMMUNITY)
Admission: AD | Admit: 2018-12-08 | Discharge: 2018-12-14 | DRG: 788 | Disposition: A | Discharge status: Home / Self Care | Payer: 59 | Attending: Obstetrics and Gynecology | Admitting: Obstetrics and Gynecology

## 2018-12-08 DIAGNOSIS — O1414 Severe pre-eclampsia complicating childbirth: Principal | ICD-10-CM | POA: Diagnosis present

## 2018-12-08 DIAGNOSIS — E669 Obesity, unspecified: Secondary | ICD-10-CM | POA: Diagnosis present

## 2018-12-08 DIAGNOSIS — Z3A34 34 weeks gestation of pregnancy: Secondary | ICD-10-CM

## 2018-12-08 DIAGNOSIS — O99214 Obesity complicating childbirth: Secondary | ICD-10-CM | POA: Diagnosis present

## 2018-12-08 DIAGNOSIS — O1413 Severe pre-eclampsia, third trimester: Secondary | ICD-10-CM | POA: Diagnosis present

## 2018-12-08 DIAGNOSIS — Z87891 Personal history of nicotine dependence: Secondary | ICD-10-CM | POA: Diagnosis not present

## 2018-12-08 DIAGNOSIS — O43123 Velamentous insertion of umbilical cord, third trimester: Secondary | ICD-10-CM | POA: Diagnosis present

## 2018-12-08 DIAGNOSIS — Z880 Allergy status to penicillin: Secondary | ICD-10-CM | POA: Diagnosis not present

## 2018-12-08 DIAGNOSIS — Z20828 Contact with and (suspected) exposure to other viral communicable diseases: Secondary | ICD-10-CM | POA: Diagnosis present

## 2018-12-08 DIAGNOSIS — O1494 Unspecified pre-eclampsia, complicating childbirth: Secondary | ICD-10-CM | POA: Diagnosis present

## 2018-12-08 DIAGNOSIS — Z98891 History of uterine scar from previous surgery: Secondary | ICD-10-CM

## 2018-12-08 DIAGNOSIS — J45909 Unspecified asthma, uncomplicated: Secondary | ICD-10-CM | POA: Diagnosis present

## 2018-12-08 DIAGNOSIS — O9952 Diseases of the respiratory system complicating childbirth: Secondary | ICD-10-CM | POA: Diagnosis present

## 2018-12-08 HISTORY — DX: Mental disorder, not otherwise specified: F99

## 2018-12-08 HISTORY — DX: Anxiety disorder, unspecified: F41.9

## 2018-12-08 LAB — URINALYSIS, ROUTINE W REFLEX MICROSCOPIC
Bilirubin Urine: NEGATIVE
Glucose, UA: NEGATIVE mg/dL
Hgb urine dipstick: NEGATIVE
Ketones, ur: NEGATIVE mg/dL
Leukocytes,Ua: NEGATIVE
Nitrite: NEGATIVE
Protein, ur: 30 mg/dL — AB
Specific Gravity, Urine: 1.014 (ref 1.005–1.030)
pH: 7 (ref 5.0–8.0)

## 2018-12-08 LAB — COMPREHENSIVE METABOLIC PANEL
ALT: 27 U/L (ref 0–44)
AST: 32 U/L (ref 15–41)
Albumin: 2.7 g/dL — ABNORMAL LOW (ref 3.5–5.0)
Alkaline Phosphatase: 118 U/L (ref 38–126)
Anion gap: 10 (ref 5–15)
BUN: 8 mg/dL (ref 6–20)
CO2: 20 mmol/L — ABNORMAL LOW (ref 22–32)
Calcium: 9.3 mg/dL (ref 8.9–10.3)
Chloride: 107 mmol/L (ref 98–111)
Creatinine, Ser: 0.77 mg/dL (ref 0.44–1.00)
GFR calc Af Amer: >60 mL/min (ref >60)
GFR calc non Af Amer: >60 mL/min (ref >60)
Glucose, Bld: 143 mg/dL — ABNORMAL HIGH (ref 70–99)
Potassium: 4.4 mmol/L (ref 3.5–5.1)
Sodium: 137 mmol/L (ref 135–145)
Total Bilirubin: <0.1 mg/dL — ABNORMAL LOW (ref 0.3–1.2)
Total Protein: 6.1 g/dL — ABNORMAL LOW (ref 6.5–8.1)

## 2018-12-08 LAB — CBC
HCT: 33.2 % — ABNORMAL LOW (ref 36.0–46.0)
Hemoglobin: 10.8 g/dL — ABNORMAL LOW (ref 12.0–15.0)
MCH: 28.6 pg (ref 26.0–34.0)
MCHC: 32.5 g/dL (ref 30.0–36.0)
MCV: 88.1 fL (ref 80.0–100.0)
Platelets: 209 10*3/uL (ref 150–400)
RBC: 3.77 MIL/uL — ABNORMAL LOW (ref 3.87–5.11)
RDW: 15.8 % — ABNORMAL HIGH (ref 11.5–15.5)
WBC: 15.4 10*3/uL — ABNORMAL HIGH (ref 4.0–10.5)
nRBC: 0.2 % (ref 0.0–0.2)

## 2018-12-08 LAB — TYPE AND SCREEN
ABO/RH(D): O POS
ABO/RH(D): O POS
Antibody Screen: NEGATIVE
Antibody Screen: NEGATIVE

## 2018-12-08 LAB — PROTEIN / CREATININE RATIO, URINE
Creatinine, Urine: 121.53 mg/dL
Protein Creatinine Ratio: 0.32 mg/mg{Cre} — ABNORMAL HIGH (ref 0.00–0.15)
Total Protein, Urine: 39 mg/dL

## 2018-12-08 MED ORDER — ACETAMINOPHEN 325 MG PO TABS
650.0000 mg | ORAL_TABLET | ORAL | Status: DC | PRN
  Administered 2018-12-08: 650 mg via ORAL
  Filled 2018-12-08: qty 2

## 2018-12-08 MED ORDER — LABETALOL HCL 200 MG PO TABS
200.0000 mg | ORAL_TABLET | Freq: Two times a day (BID) | ORAL | Status: DC
  Administered 2018-12-08 – 2018-12-10 (×4): 200 mg via ORAL
  Filled 2018-12-08 (×6): qty 1

## 2018-12-08 MED ORDER — OXYTOCIN BOLUS FROM INFUSION: 500.0000 mL | Freq: Once | INTRAVENOUS | Status: DC

## 2018-12-08 MED ORDER — FLEET ENEMA 7-19 GM/118ML RE ENEM: 1.0000 | ENEMA | RECTAL | Status: DC | PRN

## 2018-12-08 MED ORDER — LABETALOL HCL 5 MG/ML IV SOLN
INTRAVENOUS | Status: AC
  Administered 2018-12-08: 20 mg via INTRAVENOUS
  Filled 2018-12-08: qty 4

## 2018-12-08 MED ORDER — ONDANSETRON HCL 4 MG/2ML IJ SOLN: 4.0000 mg | Freq: Four times a day (QID) | INTRAMUSCULAR | Status: DC | PRN

## 2018-12-08 MED ORDER — LIDOCAINE HCL (PF) 1 % IJ SOLN: 30.0000 mL | INTRAMUSCULAR | Status: DC | PRN

## 2018-12-08 MED ORDER — LABETALOL HCL 5 MG/ML IV SOLN
80.0000 mg | INTRAVENOUS | Status: DC | PRN
  Administered 2018-12-08: 80 mg via INTRAVENOUS
  Filled 2018-12-08 (×2): qty 16

## 2018-12-08 MED ORDER — MAGNESIUM SULFATE BOLUS VIA INFUSION
4.0000 g | Freq: Once | INTRAVENOUS | Status: AC
  Administered 2018-12-08: 4 g via INTRAVENOUS
  Filled 2018-12-08: qty 1000

## 2018-12-08 MED ORDER — LABETALOL HCL 5 MG/ML IV SOLN
20.0000 mg | INTRAVENOUS | Status: DC | PRN
  Administered 2018-12-08 – 2018-12-09 (×4): 20 mg via INTRAVENOUS
  Filled 2018-12-08 (×3): qty 4

## 2018-12-08 MED ORDER — MISOPROSTOL 25 MCG QUARTER TABLET
25.0000 ug | ORAL_TABLET | ORAL | Status: DC | PRN
  Administered 2018-12-08 – 2018-12-10 (×4): 25 ug via VAGINAL
  Filled 2018-12-08 (×5): qty 1

## 2018-12-08 MED ORDER — LABETALOL HCL 5 MG/ML IV SOLN
40.0000 mg | INTRAVENOUS | Status: DC | PRN
  Administered 2018-12-08 – 2018-12-09 (×2): 40 mg via INTRAVENOUS
  Filled 2018-12-08 (×2): qty 8

## 2018-12-08 MED ORDER — SOD CITRATE-CITRIC ACID 500-334 MG/5ML PO SOLN
30.0000 mL | ORAL | Status: DC | PRN
  Administered 2018-12-08 – 2018-12-09 (×2): 30 mL via ORAL
  Filled 2018-12-08 (×3): qty 30

## 2018-12-08 MED ORDER — LACTATED RINGERS IV SOLN
INTRAVENOUS | Status: DC
  Administered 2018-12-08 – 2018-12-09 (×2): via INTRAVENOUS

## 2018-12-08 MED ORDER — OXYCODONE-ACETAMINOPHEN 5-325 MG PO TABS: 2.0000 | ORAL_TABLET | ORAL | Status: DC | PRN

## 2018-12-08 MED ORDER — LACTATED RINGERS IV SOLN
500.0000 mL | INTRAVENOUS | Status: DC | PRN
  Administered 2018-12-09: 500 mL via INTRAVENOUS

## 2018-12-08 MED ORDER — OXYCODONE-ACETAMINOPHEN 5-325 MG PO TABS: 1.0000 | ORAL_TABLET | ORAL | Status: DC | PRN

## 2018-12-08 MED ORDER — HYDRALAZINE HCL 20 MG/ML IJ SOLN
10.0000 mg | INTRAMUSCULAR | Status: DC | PRN
  Filled 2018-12-08: qty 1

## 2018-12-08 MED ORDER — MAGNESIUM SULFATE 40 GM/1000ML IV SOLN
1.0000 g/h | INTRAVENOUS | Status: AC
  Administered 2018-12-08 – 2018-12-09 (×2): 2 g/h via INTRAVENOUS
  Filled 2018-12-08 (×2): qty 1000

## 2018-12-08 MED ORDER — LACTATED RINGERS IV SOLN
INTRAVENOUS | Status: DC
  Administered 2018-12-09: 17:00:00 via INTRAVENOUS
  Administered 2018-12-10: 100 mL/h via INTRAVENOUS

## 2018-12-08 MED ORDER — LABETALOL HCL 200 MG PO TABS: 200.0000 mg | ORAL_TABLET | Freq: Once | ORAL | Status: DC

## 2018-12-08 MED ORDER — TERBUTALINE SULFATE 1 MG/ML IJ SOLN: 0.2500 mg | Freq: Once | INTRAMUSCULAR | Status: DC | PRN

## 2018-12-08 MED ORDER — OXYTOCIN 40 UNITS IN NORMAL SALINE INFUSION - SIMPLE MED
2.5000 [IU]/h | INTRAVENOUS | Status: DC
  Filled 2018-12-08: qty 1000

## 2018-12-08 MED ORDER — CALCIUM CARBONATE ANTACID 500 MG PO CHEW
2.0000 | CHEWABLE_TABLET | Freq: Once | ORAL | Status: AC
  Administered 2018-12-08: 400 mg via ORAL
  Filled 2018-12-08: qty 2

## 2018-12-08 NOTE — MAU Provider Note (Signed)
Chief Complaint  Patient presents with  . Hypertension     First Provider Initiated Contact with Patient 12/08/18 1134       S: Briana Pham  is a 33 y.o. y.o. year old G1P0 female at [redacted]w[redacted]d weeks gestation who presents to MAU with elevated blood pressures 170's/100's at home this morning. Dx Pre-E w/out severe features and hospitalized 12/2-12/3. Current blood pressure medication: Labetalol 200 mg PO BID. Took doses last night and this morning as prescribed.  Received BMZ x 2 12/2 and 12/3.  Associated symptoms: Denies Headache, vision changes, epigastric pain. Reports heartburn Contractions: denies Vaginal bleeding: Denies Fetal movement: Nml  O:  Patient Vitals for the past 24 hrs:  BP Temp Pulse Resp SpO2  12/08/18 1446 (!) 148/83 - 79 - 96 %  12/08/18 1431 (!) 150/85 - 81 - 96 %  12/08/18 1415 (!) 143/80 - 85 20 98 %  12/08/18 1400 (!) 152/83 - 84 18 99 %  12/08/18 1346 (!) 141/87 - 94 - 99 %  12/08/18 1331 134/90 - 88 - 99 %  12/08/18 1316 (!) 145/87 - 84 - -  12/08/18 1301 (!) 147/88 - 88 - 98 %  12/08/18 1246 (!) 156/83 - 89 - 98 %  12/08/18 1231 (!) 145/88 - 91 - 99 %  12/08/18 1216 (!) 160/89 - 84 - 98 %  12/08/18 1201 (!) 153/91 - 90 - 98 %  12/08/18 1131 (!) 166/92 - 91 - -  12/08/18 1116 (!) 160/89 - 80 - 99 %  12/08/18 1112 (!) 164/88 98 F (36.7 C) 87 16 -   General: NAD Heart: Regular rate Lungs: Normal rate and effort Abd: Soft, NT, Gravid, S=D Extremities: 2+ Pedal edema Neuro: 2+ deep tendon reflexes, No clonus Pelvic: NEFG, no bleeding or LOF.     VE deferred. Vertex by BS [place]  EFM: 140, Moderate variability, 15 x 15 accelerations, no decelerations Toco: Irreg w/ UI  Results for orders placed or performed during the hospital encounter of 12/08/18 (from the past 24 hour(s))  Urinalysis, Routine w reflex microscopic     Status: Abnormal   Collection Time: 12/08/18 11:20 AM  Result Value Ref Range   Color, Urine YELLOW YELLOW   APPearance HAZY  (A) CLEAR   Specific Gravity, Urine 1.014 1.005 - 1.030   pH 7.0 5.0 - 8.0   Glucose, UA NEGATIVE NEGATIVE mg/dL   Hgb urine dipstick NEGATIVE NEGATIVE   Bilirubin Urine NEGATIVE NEGATIVE   Ketones, ur NEGATIVE NEGATIVE mg/dL   Protein, ur 30 (A) NEGATIVE mg/dL   Nitrite NEGATIVE NEGATIVE   Leukocytes,Ua NEGATIVE NEGATIVE   RBC / HPF 0-5 0 - 5 RBC/hpf   WBC, UA 0-5 0 - 5 WBC/hpf   Bacteria, UA RARE (A) NONE SEEN   Squamous Epithelial / LPF 6-10 0 - 5   Mucus PRESENT   Protein / creatinine ratio, urine     Status: Abnormal   Collection Time: 12/08/18 11:20 AM  Result Value Ref Range   Creatinine, Urine 121.53 mg/dL   Total Protein, Urine 39 mg/dL   Protein Creatinine Ratio 0.32 (H) 0.00 - 0.15 mg/mg[Cre]  Comprehensive metabolic panel     Status: Abnormal   Collection Time: 12/08/18 11:46 AM  Result Value Ref Range   Sodium 137 135 - 145 mmol/L   Potassium 4.4 3.5 - 5.1 mmol/L   Chloride 107 98 - 111 mmol/L   CO2 20 (L) 22 - 32 mmol/L   Glucose, Bld  143 (H) 70 - 99 mg/dL   BUN 8 6 - 20 mg/dL   Creatinine, Ser 5.57 0.44 - 1.00 mg/dL   Calcium 9.3 8.9 - 32.2 mg/dL   Total Protein 6.1 (L) 6.5 - 8.1 g/dL   Albumin 2.7 (L) 3.5 - 5.0 g/dL   AST 32 15 - 41 U/L   ALT 27 0 - 44 U/L   Alkaline Phosphatase 118 38 - 126 U/L   Total Bilirubin <0.1 (L) 0.3 - 1.2 mg/dL   GFR calc non Af Amer >60 >60 mL/min   GFR calc Af Amer >60 >60 mL/min   Anion gap 10 5 - 15  CBC     Status: Abnormal   Collection Time: 12/08/18 11:46 AM  Result Value Ref Range   WBC 15.4 (H) 4.0 - 10.5 K/uL   RBC 3.77 (L) 3.87 - 5.11 MIL/uL   Hemoglobin 10.8 (L) 12.0 - 15.0 g/dL   HCT 02.5 (L) 42.7 - 06.2 %   MCV 88.1 80.0 - 100.0 fL   MCH 28.6 26.0 - 34.0 pg   MCHC 32.5 30.0 - 36.0 g/dL   RDW 37.6 (H) 28.3 - 15.1 %   Platelets 209 150 - 400 K/uL   nRBC 0.2 0.0 - 0.2 %   MAU course Discussed history, exam, labs, severe range blood pressures with Dr. Marice Potter.  Recommends admission.  Called Dr. Mindi Slicker.  No answer  will call again in 5 minutes.  MDM - Pre-E w/ Severe features (BP). Meets criteria for delivery.   A: [redacted]w[redacted]d week IUP 1. Preeclampsia, severe, third trimester   Penicillin allergy FHR reactive  P: Admit to L&D per consult with Edwinna Areola, DO. Cytotec Magnesium sulfate Dr. Mindi Slicker will determine prophylaxis for unknown GBS status preterm GBS PCR pending  Katrinka Blazing [place], [place] 12/08/2018 2:54 PM

## 2018-12-08 NOTE — H&P (Addendum)
SHACORA ZYNDA is a 33 y.o. prime female at 3 5/[redacted]wks gestation with preeclampsia. Pt is dated via IVF. Had first trimester vanishing twin. Low risk panorama. Elevated BPs first noted at [redacted] weeks gestation. PreE labs have been normal but she has had proteinuria. No HA or blurry vision. Pt was admitted on 12/2-12/3 for BMZ given BP. She called the office today to report elevated Bp despite taking labetalol 200mg  po bid as prescribed. She required several doses of IV labetalol in MAU. Pt admitted for iol due to preeclampsia. GBS unknown. Her pregnancy is also complicated by AMA, obesity and history of asthma.  OB History    Gravida  1   Para      Term      Preterm      AB      Living        SAB      TAB      Ectopic      Multiple      Live Births             Past Medical History:  Diagnosis Date  . Asthma   . Insomnia    Past Surgical History:  Procedure Laterality Date  . HYSTEROSCOPY    . ivf    . MOUTH SURGERY    . WISDOM TOOTH EXTRACTION     Family History: family history is not on file. Social History:  reports that she has quit smoking. She has never used smokeless tobacco. She reports that she does not drink alcohol or use drugs.     Maternal Diabetes: No Genetic Screening: Normal Maternal Ultrasounds/Referrals: Normal Fetal Ultrasounds or other Referrals:  None Maternal Substance Abuse:  No Significant Maternal Medications:  Meds include: Other: labetalol 200mg  po bid Significant Maternal Lab Results:  Other:  GBS unknown Other Comments:  None  ROS History   Blood pressure (!) 159/92, pulse 85, temperature 98 F (36.7 C), resp. rate 20, SpO2 99 %. Exam Physical Exam  Prenatal labs: ABO, Rh: --/--/O POS, O POS Performed at [facility], [place] [address] [place], [place] [address]  [phone]/02 1355) Antibody: NEG (12/02 1355) Rubella: Immune (06/23 0000) RPR:    HBsAg: Negative (06/23 0000)  HIV: Non-reactive (10/13 0000)  GBS:      Assessment/Plan: 33yo prime at 26 5/7wks with preeclampsia for iol - Admit - Cytotec for ripening - MGSO4 for seizure prophylaxis: start 4gm bolus - Continue labetalol 200mg  po bid -S/P BMZ 12/2 and 12/3 - May eat now then NPO -GBS rapid ordered - Sars covid ordered   Venetia Night Man Effertz 12/08/2018, 4:12 PM

## 2018-12-08 NOTE — Progress Notes (Signed)
Patient ID: Briana Pham, female   DOB: [birth year], 33 y.o.   MRN: [id number] Pt received one more dose of IV labetalol while on L/D.  BP now 157/96 She denies HA or blurry vision. Tolerating MgSO4 EFM - 135, cat 1 TOCO - no contractions SVE - 0/50/-2  A/P: 33yo G1 at 34 5/7wks          Discussed options for ripening: misoprostol alone vs misoprostol + foley bulb. Pt requests no foley. Understands could shorten length of latent phase. Wants to reconsider at next check.           Cytotec placed          Continue to monitor BPs

## 2018-12-08 NOTE — MAU Note (Signed)
.   Briana Pham is a 33 y.o. at [redacted]w[redacted]d here in MAU reporting: she took her BP at home and it was elevated. PT was discharged home last night from [facility] for PREE LMP: Onset of complaint: ongoing Pain score: 0 Vitals:   12/08/18 1112  BP: (!) 164/88  Pulse: 87  Resp: 16  Temp: 98 F (36.7 C)     FHT: Lab orders placed from triage:

## 2018-12-09 LAB — GROUP B STREP BY PCR: Group B strep by PCR: POSITIVE — AB

## 2018-12-09 LAB — RPR: RPR Ser Ql: NONREACTIVE

## 2018-12-09 LAB — MAGNESIUM: Magnesium: 4.5 mg/dL — ABNORMAL HIGH (ref 1.7–2.4)

## 2018-12-09 MED ORDER — TERBUTALINE SULFATE 1 MG/ML IJ SOLN: 0.2500 mg | Freq: Once | INTRAMUSCULAR | Status: DC | PRN

## 2018-12-09 MED ORDER — OXYTOCIN 40 UNITS IN NORMAL SALINE INFUSION - SIMPLE MED
1.0000 m[IU]/min | INTRAVENOUS | Status: DC
  Administered 2018-12-09: 2 m[IU]/min via INTRAVENOUS

## 2018-12-09 MED ORDER — LABETALOL HCL 5 MG/ML IV SOLN
20.0000 mg | INTRAVENOUS | Status: DC | PRN
  Administered 2018-12-10: 20 mg via INTRAVENOUS
  Filled 2018-12-09: qty 4

## 2018-12-09 MED ORDER — VANCOMYCIN HCL IN DEXTROSE 1-5 GM/200ML-% IV SOLN
1000.0000 mg | Freq: Two times a day (BID) | INTRAVENOUS | Status: DC
  Administered 2018-12-09 – 2018-12-10 (×3): 1000 mg via INTRAVENOUS
  Filled 2018-12-09 (×4): qty 200

## 2018-12-09 MED ORDER — BUTORPHANOL TARTRATE 1 MG/ML IJ SOLN
1.0000 mg | INTRAMUSCULAR | Status: DC | PRN
  Administered 2018-12-09 – 2018-12-10 (×4): 1 mg via INTRAVENOUS
  Filled 2018-12-09 (×4): qty 1

## 2018-12-09 MED ORDER — LABETALOL HCL 5 MG/ML IV SOLN: 40.0000 mg | INTRAVENOUS | Status: DC | PRN

## 2018-12-09 MED ORDER — MISOPROSTOL 50MCG HALF TABLET
ORAL_TABLET | ORAL | Status: AC
  Filled 2018-12-09: qty 1

## 2018-12-09 MED ORDER — HYDRALAZINE HCL 20 MG/ML IJ SOLN
5.0000 mg | INTRAMUSCULAR | Status: DC | PRN
  Administered 2018-12-12: 5 mg via INTRAVENOUS
  Filled 2018-12-09: qty 1

## 2018-12-09 MED ORDER — MISOPROSTOL 50MCG HALF TABLET
50.0000 ug | ORAL_TABLET | Freq: Once | ORAL | Status: AC
  Administered 2018-12-09: 50 ug via ORAL

## 2018-12-09 MED ORDER — HYDRALAZINE HCL 20 MG/ML IJ SOLN
10.0000 mg | INTRAMUSCULAR | Status: DC | PRN
  Filled 2018-12-09: qty 1

## 2018-12-09 NOTE — Progress Notes (Addendum)
Patient ID: Briana Pham, female   DOB: 1985/08/25, 33 y.o.   MRN: [id number] Pt reports mild fatigue but otherwise well. Denies HA or blurry vision. +Fms appreciated. Occasional contractions.   VS - 148-160/100-102 GEN - NAD EFM - 125, cat 2, no decels TOCO - rare contraction SVE - foley catheter still in place; minimal movement   A/P: Prime at 31 6/7wks with preE      - On MgSO4 - check Mg level. If high will decrease rate of IV      -  S/P stadol x 2; hold for now given minimal variability      -  Increase pitocin to 8 mus      - S/P labetalol 40 IV 20 mins ago; due now for 80 IV; if BP stays elevated will switch to hydralazine protocol

## 2018-12-09 NOTE — Progress Notes (Signed)
Patient ID: Briana Pham, female   DOB: 02-24-1985, 33 y.o.   MRN: [id number] Pt received 3 vaginal doses of cytotec overnight. At 6am cervix was noted at 1cm dilated.  Came to bedside and had discussion with pt and partner about alternative induction options. I recommmended foley with oral cytotec as starting on pitocin at this time might cause her to have a prolonged duration of labor on pitocin. Reviewed risks and benefits of pitocin including augmentation of labor and risk of SIADH.  Pt agreed to oral cytotec ( received at 630a) and now foley bulb. Bulb placed with 60u/30v saline   BP - 155/100, 77, 18  EFM - 135, cat 1 TOCO - rare contraction q 1-59mins SVE - 1/long/-3  A/P: 33yo prime at 48 6/7wks with preE; on MgSO4 now; also on labetatol 200mg  bid ( due at 10am)          Continue with induction and current care

## 2018-12-09 NOTE — Progress Notes (Addendum)
Patient ID: Briana Pham, female   DOB: 1985-02-04, 33 y.o.   MRN: [id number]  Pt reports pain with contractions. Foley bulb still in place despite increasing pitocin per protocol; now at 70mus. No HA or blurry vision. Some diffuse edema  VS - 134-137/86- 90, 72 GEN - NAD EFM - 120, cat 1 TOCO - irreg contractions q 1-54mins SVE - removed 10cc saline from uterine bulb ( now at 50) still  in place  Bedside US confirns vertex  A/P: 33yo G1P0 at 34 6/7wks with preE          - BP stable now on MgSO4 and labetalol 200mg  BID         - Prolonged latent phase         - Continue pitocin per protocol         - vanc for GBS tx; second dose given at 2000pm         - consider epidural for relaxation?

## 2018-12-10 ENCOUNTER — Inpatient Hospital Stay (HOSPITAL_COMMUNITY): Payer: 59 | Admitting: Anesthesiology

## 2018-12-10 ENCOUNTER — Encounter (HOSPITAL_COMMUNITY)
Admission: AD | Disposition: A | Discharge status: Home / Self Care | Payer: Self-pay | Source: Home / Self Care | Attending: Obstetrics and Gynecology

## 2018-12-10 ENCOUNTER — Encounter (HOSPITAL_COMMUNITY): Payer: Self-pay | Admitting: Obstetrics and Gynecology

## 2018-12-10 LAB — CBC
HCT: 34.5 % — ABNORMAL LOW (ref 36.0–46.0)
HCT: 34.7 % — ABNORMAL LOW (ref 36.0–46.0)
Hemoglobin: 11.2 g/dL — ABNORMAL LOW (ref 12.0–15.0)
Hemoglobin: 11.4 g/dL — ABNORMAL LOW (ref 12.0–15.0)
MCH: 28.4 pg (ref 26.0–34.0)
MCH: 28.6 pg (ref 26.0–34.0)
MCHC: 32.5 g/dL (ref 30.0–36.0)
MCHC: 32.9 g/dL (ref 30.0–36.0)
MCV: 87.2 fL (ref 80.0–100.0)
MCV: 87.3 fL (ref 80.0–100.0)
Platelets: 184 10*3/uL (ref 150–400)
Platelets: 193 10*3/uL (ref 150–400)
RBC: 3.95 MIL/uL (ref 3.87–5.11)
RBC: 3.98 MIL/uL (ref 3.87–5.11)
RDW: 15.6 % — ABNORMAL HIGH (ref 11.5–15.5)
RDW: 15.7 % — ABNORMAL HIGH (ref 11.5–15.5)
WBC: 11.2 10*3/uL — ABNORMAL HIGH (ref 4.0–10.5)
WBC: 13.4 10*3/uL — ABNORMAL HIGH (ref 4.0–10.5)
nRBC: 0.1 % (ref 0.0–0.2)
nRBC: 0.2 % (ref 0.0–0.2)

## 2018-12-10 LAB — MAGNESIUM: Magnesium: 3.9 mg/dL — ABNORMAL HIGH (ref 1.7–2.4)

## 2018-12-10 SURGERY — Surgical Case
Anesthesia: Epidural

## 2018-12-10 MED ORDER — ONDANSETRON HCL 4 MG/2ML IJ SOLN
INTRAMUSCULAR | Status: AC
  Filled 2018-12-10: qty 2

## 2018-12-10 MED ORDER — MISOPROSTOL 50MCG HALF TABLET
50.0000 ug | ORAL_TABLET | ORAL | Status: DC
  Administered 2018-12-10 (×2): 50 ug via ORAL
  Filled 2018-12-10: qty 1

## 2018-12-10 MED ORDER — NALBUPHINE HCL 10 MG/ML IJ SOLN: 5.0000 mg | INTRAMUSCULAR | Status: DC | PRN

## 2018-12-10 MED ORDER — NALOXONE HCL 0.4 MG/ML IJ SOLN: 0.4000 mg | INTRAMUSCULAR | Status: DC | PRN

## 2018-12-10 MED ORDER — SODIUM CHLORIDE 0.9% FLUSH
3.0000 mL | INTRAVENOUS | Status: DC | PRN
  Administered 2018-12-13: 3 mL via INTRAVENOUS
  Filled 2018-12-10: qty 3

## 2018-12-10 MED ORDER — KETOROLAC TROMETHAMINE 30 MG/ML IJ SOLN
30.0000 mg | Freq: Once | INTRAMUSCULAR | Status: AC | PRN
  Administered 2018-12-10: 30 mg via INTRAVENOUS

## 2018-12-10 MED ORDER — KETOROLAC TROMETHAMINE 30 MG/ML IJ SOLN
INTRAMUSCULAR | Status: AC
  Filled 2018-12-10: qty 1

## 2018-12-10 MED ORDER — SODIUM CHLORIDE (PF) 0.9 % IJ SOLN
INTRAMUSCULAR | Status: DC | PRN
  Administered 2018-12-10: 12 mL/h via EPIDURAL

## 2018-12-10 MED ORDER — DIPHENHYDRAMINE HCL 50 MG/ML IJ SOLN: 12.5000 mg | INTRAMUSCULAR | Status: DC | PRN

## 2018-12-10 MED ORDER — CLINDAMYCIN PHOSPHATE 900 MG/50ML IV SOLN
INTRAVENOUS | Status: AC
  Filled 2018-12-10: qty 50

## 2018-12-10 MED ORDER — PHENYLEPHRINE 40 MCG/ML (10ML) SYRINGE FOR IV PUSH (FOR BLOOD PRESSURE SUPPORT): 80.0000 ug | PREFILLED_SYRINGE | INTRAVENOUS | Status: DC | PRN

## 2018-12-10 MED ORDER — ACETAMINOPHEN 500 MG PO TABS
1000.0000 mg | ORAL_TABLET | Freq: Four times a day (QID) | ORAL | Status: AC
  Administered 2018-12-11 (×4): 1000 mg via ORAL
  Filled 2018-12-10 (×4): qty 2

## 2018-12-10 MED ORDER — PROMETHAZINE HCL 25 MG/ML IJ SOLN: 6.2500 mg | INTRAMUSCULAR | Status: DC | PRN

## 2018-12-10 MED ORDER — MEPERIDINE HCL 25 MG/ML IJ SOLN: 6.2500 mg | INTRAMUSCULAR | Status: DC | PRN

## 2018-12-10 MED ORDER — SCOPOLAMINE 1 MG/3DAYS TD PT72: 1.0000 | MEDICATED_PATCH | TRANSDERMAL | Status: DC

## 2018-12-10 MED ORDER — LIDOCAINE-EPINEPHRINE 2 %-1:100000 IJ SOLN
INTRAMUSCULAR | Status: DC | PRN
  Administered 2018-12-10: 10 mL
  Administered 2018-12-10 (×2): 4 mL

## 2018-12-10 MED ORDER — SOD CITRATE-CITRIC ACID 500-334 MG/5ML PO SOLN
30.0000 mL | Freq: Once | ORAL | Status: AC
  Administered 2018-12-10: 30 mL via ORAL

## 2018-12-10 MED ORDER — MORPHINE SULFATE (PF) 0.5 MG/ML IJ SOLN
INTRAMUSCULAR | Status: AC
  Filled 2018-12-10: qty 10

## 2018-12-10 MED ORDER — EPHEDRINE 5 MG/ML INJ: 10.0000 mg | INTRAVENOUS | Status: DC | PRN

## 2018-12-10 MED ORDER — LABETALOL HCL 200 MG PO TABS
200.0000 mg | ORAL_TABLET | Freq: Two times a day (BID) | ORAL | Status: DC
  Administered 2018-12-11 – 2018-12-14 (×8): 200 mg via ORAL
  Filled 2018-12-10 (×8): qty 1

## 2018-12-10 MED ORDER — MEPERIDINE HCL 25 MG/ML IJ SOLN
INTRAMUSCULAR | Status: DC | PRN
  Administered 2018-12-10 (×2): 12.5 mg via INTRAVENOUS

## 2018-12-10 MED ORDER — OXYTOCIN 40 UNITS IN NORMAL SALINE INFUSION - SIMPLE MED
INTRAVENOUS | Status: AC
  Filled 2018-12-10: qty 1000

## 2018-12-10 MED ORDER — SODIUM CHLORIDE 0.9 % IV SOLN
INTRAVENOUS | Status: DC | PRN
  Administered 2018-12-10: 22:00:00 via INTRAVENOUS

## 2018-12-10 MED ORDER — HYDROMORPHONE HCL 1 MG/ML IJ SOLN
INTRAMUSCULAR | Status: AC
  Filled 2018-12-10: qty 0.5

## 2018-12-10 MED ORDER — NALOXONE HCL 4 MG/10ML IJ SOLN
1.0000 ug/kg/h | INTRAVENOUS | Status: DC | PRN
  Filled 2018-12-10: qty 5

## 2018-12-10 MED ORDER — CLINDAMYCIN PHOSPHATE 900 MG/50ML IV SOLN
900.0000 mg | Freq: Once | INTRAVENOUS | Status: AC
  Administered 2018-12-10: 21:00:00 900 mg via INTRAVENOUS

## 2018-12-10 MED ORDER — SCOPOLAMINE 1 MG/3DAYS TD PT72
1.0000 | MEDICATED_PATCH | Freq: Once | TRANSDERMAL | Status: AC
  Administered 2018-12-10: 1.5 mg via TRANSDERMAL

## 2018-12-10 MED ORDER — KETOROLAC TROMETHAMINE 30 MG/ML IJ SOLN
30.0000 mg | Freq: Four times a day (QID) | INTRAMUSCULAR | Status: AC | PRN
  Administered 2018-12-11 (×2): 30 mg via INTRAVENOUS
  Filled 2018-12-10: qty 1

## 2018-12-10 MED ORDER — HYDROMORPHONE HCL 1 MG/ML IJ SOLN
0.2500 mg | INTRAMUSCULAR | Status: DC | PRN
  Administered 2018-12-10 (×2): 0.25 mg via INTRAVENOUS

## 2018-12-10 MED ORDER — MAGNESIUM SULFATE 40 GM/1000ML IV SOLN
2.0000 g/h | INTRAVENOUS | Status: AC
  Administered 2018-12-10 – 2018-12-11 (×2): 2 g/h via INTRAVENOUS
  Filled 2018-12-10: qty 1000

## 2018-12-10 MED ORDER — ONDANSETRON HCL 4 MG/2ML IJ SOLN
INTRAMUSCULAR | Status: DC | PRN
  Administered 2018-12-10: 4 mg via INTRAVENOUS

## 2018-12-10 MED ORDER — DEXAMETHASONE SODIUM PHOSPHATE 4 MG/ML IJ SOLN
INTRAMUSCULAR | Status: AC
  Filled 2018-12-10: qty 1

## 2018-12-10 MED ORDER — FENTANYL-BUPIVACAINE-NACL 0.5-0.125-0.9 MG/250ML-% EP SOLN
12.0000 mL/h | EPIDURAL | Status: DC | PRN
  Filled 2018-12-10: qty 250

## 2018-12-10 MED ORDER — MEPERIDINE HCL 25 MG/ML IJ SOLN
INTRAMUSCULAR | Status: AC
  Filled 2018-12-10: qty 1

## 2018-12-10 MED ORDER — MORPHINE SULFATE (PF) 0.5 MG/ML IJ SOLN
INTRAMUSCULAR | Status: DC | PRN
  Administered 2018-12-10: 3 mg via EPIDURAL

## 2018-12-10 MED ORDER — OXYTOCIN 40 UNITS IN NORMAL SALINE INFUSION - SIMPLE MED
INTRAVENOUS | Status: DC | PRN
  Administered 2018-12-10: 40 [IU] via INTRAVENOUS

## 2018-12-10 MED ORDER — ONDANSETRON HCL 4 MG/2ML IJ SOLN: 4.0000 mg | Freq: Three times a day (TID) | INTRAMUSCULAR | Status: DC | PRN

## 2018-12-10 MED ORDER — LIDOCAINE HCL (PF) 1 % IJ SOLN
INTRAMUSCULAR | Status: DC | PRN
  Administered 2018-12-10: 4 mL via EPIDURAL

## 2018-12-10 MED ORDER — NALBUPHINE HCL 10 MG/ML IJ SOLN: 5.0000 mg | Freq: Once | INTRAMUSCULAR | Status: DC | PRN

## 2018-12-10 MED ORDER — DEXAMETHASONE SODIUM PHOSPHATE 4 MG/ML IJ SOLN
INTRAMUSCULAR | Status: DC | PRN
  Administered 2018-12-10: 4 mg via INTRAVENOUS

## 2018-12-10 MED ORDER — LACTATED RINGERS IV SOLN: 500.0000 mL | Freq: Once | INTRAVENOUS | Status: DC

## 2018-12-10 MED ORDER — MISOPROSTOL 50MCG HALF TABLET
ORAL_TABLET | ORAL | Status: AC
  Filled 2018-12-10: qty 1

## 2018-12-10 MED ORDER — DIPHENHYDRAMINE HCL 25 MG PO CAPS: 25.0000 mg | ORAL_CAPSULE | ORAL | Status: DC | PRN

## 2018-12-10 MED ORDER — KETOROLAC TROMETHAMINE 30 MG/ML IJ SOLN
30.0000 mg | Freq: Four times a day (QID) | INTRAMUSCULAR | Status: AC | PRN
  Filled 2018-12-10: qty 1

## 2018-12-10 MED ORDER — MAGNESIUM SULFATE 40 GM/1000ML IV SOLN
INTRAVENOUS | Status: AC
  Filled 2018-12-10: qty 1000

## 2018-12-10 MED ORDER — SODIUM CHLORIDE 0.9 % IR SOLN
Status: DC | PRN
  Administered 2018-12-10: 1

## 2018-12-10 MED ORDER — SCOPOLAMINE 1 MG/3DAYS TD PT72
MEDICATED_PATCH | TRANSDERMAL | Status: AC
  Filled 2018-12-10: qty 1

## 2018-12-10 SURGICAL SUPPLY — 40 items
BENZOIN TINCTURE PRP APPL 2/3 (GAUZE/BANDAGES/DRESSINGS) ×3 IMPLANT
CHLORAPREP W/TINT 26ML (MISCELLANEOUS) ×3 IMPLANT
CLAMP CORD UMBIL (MISCELLANEOUS) IMPLANT
CLOSURE STERI-STRIP 1/2X4 (GAUZE/BANDAGES/DRESSINGS) ×1
CLOSURE WOUND 1/2 X4 (GAUZE/BANDAGES/DRESSINGS)
CLOTH BEACON ORANGE TIMEOUT ST (SAFETY) ×3 IMPLANT
CLSR STERI-STRIP ANTIMIC 1/2X4 (GAUZE/BANDAGES/DRESSINGS) ×2 IMPLANT
DRAPE C SECTION CLR SCREEN (DRAPES) ×3 IMPLANT
DRSG OPSITE POSTOP 4X10 (GAUZE/BANDAGES/DRESSINGS) ×3 IMPLANT
ELECT REM PT RETURN 9FT ADLT (ELECTROSURGICAL) ×3
ELECTRODE REM PT RTRN 9FT ADLT (ELECTROSURGICAL) ×1 IMPLANT
EXTRACTOR VACUUM KIWI (MISCELLANEOUS) IMPLANT
GLOVE BIO SURGEON STRL SZ 6.5 (GLOVE) ×2 IMPLANT
GLOVE BIO SURGEONS STRL SZ 6.5 (GLOVE) ×1
GLOVE BIOGEL PI IND STRL 7.0 (GLOVE) ×2 IMPLANT
GLOVE BIOGEL PI INDICATOR 7.0 (GLOVE) ×4
GOWN STRL REUS W/TWL LRG LVL3 (GOWN DISPOSABLE) ×6 IMPLANT
HEMOSTAT ARISTA ABSORB 3G PWDR (HEMOSTASIS) ×3 IMPLANT
KIT ABG SYR 3ML LUER SLIP (SYRINGE) IMPLANT
NEEDLE HYPO 25X5/8 SAFETYGLIDE (NEEDLE) IMPLANT
NS IRRIG 1000ML POUR BTL (IV SOLUTION) ×3 IMPLANT
PACK C SECTION WH (CUSTOM PROCEDURE TRAY) ×3 IMPLANT
PAD OB MATERNITY 4.3X12.25 (PERSONAL CARE ITEMS) ×3 IMPLANT
RETRACTOR WND ALEXIS 25 LRG (MISCELLANEOUS) ×1 IMPLANT
RTRCTR C-SECT PINK 25CM LRG (MISCELLANEOUS) IMPLANT
RTRCTR WOUND ALEXIS 25CM LRG (MISCELLANEOUS) ×3
STRIP CLOSURE SKIN 1/2X4 (GAUZE/BANDAGES/DRESSINGS) IMPLANT
SUT CHROMIC 1 CTX 36 (SUTURE) ×6 IMPLANT
SUT PLAIN 0 NONE (SUTURE) IMPLANT
SUT PLAIN 2 0 XLH (SUTURE) ×3 IMPLANT
SUT VIC AB 0 CT1 27 (SUTURE) ×4
SUT VIC AB 0 CT1 27XBRD ANBCTR (SUTURE) ×2 IMPLANT
SUT VIC AB 2-0 CT1 27 (SUTURE) ×2
SUT VIC AB 2-0 CT1 TAPERPNT 27 (SUTURE) ×1 IMPLANT
SUT VIC AB 3-0 CT1 27 (SUTURE)
SUT VIC AB 3-0 CT1 TAPERPNT 27 (SUTURE) IMPLANT
SUT VIC AB 4-0 KS 27 (SUTURE) ×3 IMPLANT
TOWEL OR 17X24 6PK STRL BLUE (TOWEL DISPOSABLE) ×3 IMPLANT
TRAY FOLEY W/BAG SLVR 14FR LF (SET/KITS/TRAYS/PACK) ×3 IMPLANT
WATER STERILE IRR 1000ML POUR (IV SOLUTION) ×3 IMPLANT

## 2018-12-10 NOTE — Transfer of Care (Signed)
Immediate Anesthesia Transfer of Care Note  [name]  Procedure(s) Performed: CESAREAN SECTION (N/A )  Patient Location: PACU  Anesthesia Type:Epidural  Level of Consciousness: awake, alert  and patient cooperative  Airway & Oxygen Therapy: Patient Spontanous Breathing  Post-op Assessment: Report given to RN, Post -op Vital signs reviewed and stable and Patient moving all extremities X 4  Post vital signs: Reviewed and stable  Last Vitals:  Vitals Value Taken Time  BP 134/85 12/10/18 2240  Temp    Pulse 73 12/10/18 2244  Resp 19 12/10/18 2244  SpO2 97 % 12/10/18 2244  Vitals shown include unvalidated device data.  Last Pain:  Vitals:   12/10/18 2000  TempSrc:   PainSc: 0-No pain         Complications: No apparent anesthesia complications

## 2018-12-10 NOTE — Anesthesia Procedure Notes (Signed)
Epidural Patient location during procedure: OB Start time: 12/10/2018 2:39 AM End time: 12/10/2018 2:59 AM  Staffing Anesthesiologist: Barnet Glasgow, MD Performed: anesthesiologist   Preanesthetic Checklist Completed: patient identified, site marked, surgical consent, pre-op evaluation, timeout performed, IV checked, risks and benefits discussed and monitors and equipment checked  Epidural Patient position: sitting Prep: site prepped and draped and DuraPrep Patient monitoring: continuous pulse ox and blood pressure Approach: midline Location: L3-L4 Injection technique: LOR air  Needle:  Needle type: Tuohy  Needle gauge: 17 G Needle length: 9 cm and 9 Needle insertion depth: 7 cm Catheter type: closed end flexible Catheter size: 19 Gauge Catheter at skin depth: 12 cm Test dose: negative  Assessment Events: blood not aspirated, injection not painful, no injection resistance, negative IV test and no paresthesia  Additional Notes Patient identified. Risks/Benefits/Options discussed with patient including but not limited to bleeding, infection, nerve damage, paralysis, failed block, incomplete pain control, headache, blood pressure changes, nausea, vomiting, reactions to medication both or allergic, itching and postpartum back pain. Confirmed with bedside nurse the patient's most recent platelet count. Confirmed with patient that they are not currently taking any anticoagulation, have any bleeding history or any family history of bleeding disorders. Patient expressed understanding and wished to proceed. All questions were answered. Sterile technique was used throughout the entire procedure. Please see nursing notes for vital signs. Test dose was given through epidural needle and negative prior to continuing to dose epidural or start infusion. Warning signs of high block given to the patient including shortness of breath, tingling/numbness in hands, complete motor block, or any  concerning symptoms with instructions to call for help. Patient was given instructions on fall risk and not to get out of bed. All questions and concerns addressed with instructions to call with any issues.2  Attempt (S) . Patient tolerated procedure well.

## 2018-12-10 NOTE — Progress Notes (Signed)
Patient ID: Briana Pham, female   DOB: 10/14/1985, 33 y.o.   MRN: [id number] Late entry  Foley bulb removed after 24hrs, after pitocin at 40mus and after epidural x 5 hours.  Cervix 2cm and thick  Discussed next step with pt: advised oral cytotec - pt agrees Pt and partner agree  Cat 1 strip Irregular contractions  Continue on MgSO4: BP stable

## 2018-12-10 NOTE — Progress Notes (Addendum)
Patient ID: Briana Pham, female   DOB: 1985-10-23, 33 y.o.   MRN: [id number] Pt received a total of 3 doses of cytotec after pitocin stopped ( 7 total including 4 doses prior to pitocin): 50buccal/25 vaginal/ 50 buccal Cervix still 1-2cm dil and no further effacement Ctxs q 10-39mins  Had an brief period of elevated BP requiring a dose of iv labetalol just 30 minutes after had received her oral labetalol. Discovered pt never received her am dose of labetalol.   Cat 1 strip, baseline 135  A/P: Discussed continued care with pt and recommend cesarean section. Pt scared but agreeable. Reviewed procedure with risks and benefits once more Consent signed Pt to OR when available Cleocin, scop patch, oracit and scds ordered

## 2018-12-10 NOTE — Op Note (Signed)
Operative Note    Preoperative Diagnosis: IUP at 91 0/7wks                                             Arrest of dilation                                             IOL due to preE - on MgSO4   Postoperative Diagnosis: Same                                               Adherent placenta   Procedure: Primary low transverse cesarean section with double layered closure   Surgeon: Mickle Mallory DO Scrub: Jessica EG   Anesthesia: Epidural  Fluids: LR 2L EBL: 422ml UOP: 136ml   Findings: Viable female infant in vertex position. Grossly normal uterus and ovaries. Moderately adherent, small placenta with velamentous cord insertion.  Apgars and weight of baby pending NICU   Specimen: Placenta to pathology   Procedure Note: After two days of iol ( 4 doses cytotec +  foley balloon, pitocin to 10mus, then 3 more doses cytotec) with maximum cervical of 1-2cm while on MgSo4 for preE, decision made to proceed to delivery via c/section. Cat 1 strip  Patient was taken to the operating room where epidural anesthesia was found to be adequate. She was placed in the dorsal supine position with a leftward tilt. A foley was placed  in a sterile manner to drain the bladder.  Pt was prepped and draped in the usual sterile fashion. An appropriate time out was performed. Allis clamp test confirmed adequate anesthesia. A Pfannenstiel skin incision was then made with the scalpel and carried through to the underlying layer of fascia by sharp dissection and Bovie cautery. The fascia was nicked in the midline and the incision was extended laterally with Mayo scissors. The superior, then inferior, aspects of the incision were grasped with Coker clamps and dissected off the underlying rectus muscles. Rectus muscles were separated in the midline and the peritoneal cavity entered bluntly. The peritoneal incision was then extended both superiorly and inferiorly with careful attention to avoid both bowel and bladder. The  Alexis self-retaining wound retractor was then placed within the incision and the lower uterine segment exposed. The bladder flap was developed with Metzenbaum scissors and pushed away from the lower uterine segment. The lower uterine segment was then incised in a transverse fashion and the cavity itself entered bluntly. The incision was extended bluntly. The vertex was delivered from the incision with one application of kiwi vacuum -  one pull only; no pop offs -  without difficulty. The remainder of the infant delivered easily. Bulb suction of mouth and nose was performed.  After approximately 2-20mins  the cord was clamped and cut due to placenta being noted to have started expulsing ( pt had requesteed delayed cord clamping). The infant was handed off to the waiting NICU team. The placenta was noted to have a thin umbilical cord insertion site with begun to avulse with mild traction. The remainder of the placenta was noted to be moderately adherent to the uterus -  causing mild involution while separating. The uterine cavity was carefully inspected to make sure no placental remnants or membranes left. The uterine incision was then repaired in 2 layers:  the first layer was a running locked layer 0 chromic and the second an imbricating layer of the same suture. Small areas of bleeding with suture ligated and Arista applied - great hemostasis noted.  The tubes and ovaries were inspected and the gutters cleared of all clots and debris. The uterine incision was inspected again and found to be hemostatic. All instruments and sponges as well as the Alexis retractor were then removed from the abdomen. The  peritoneum was then sutured in a running fashion incorporating the rectus musclesThe fascia was then closed with 0 Vicryl in a running fashion. Subcutaneous tissue was reapproximated with 3-0 plain in a running fashion. The skin was closed with a subcuticular stitch of 4-0 Vicryl on a Keith needle and then reinforced  with benzoin and Steri-Strips. At the conclusion of the procedure all instruments and sponge counts were correct. Patient was taken to the recovery room in good condition with her baby at her her side but with nurses

## 2018-12-10 NOTE — Progress Notes (Signed)
Patient ID: Briana Pham, female   DOB: 23-Feb-1985, 33 y.o.   MRN: [id number] Pt feels well with no complaints. Denies HA or blurry vision. +Fms VS - 135/70   UOP - 1022ml in past 1-2 hrs GEN - NAD EFM - 135. Cat 1 TOCO - irreg contractions; hard to monitor on her side SVE - 1-2/70/-2  A/P: 33yo prime at 35 0/7wks with preE          - 48mcg vaginal cytotec given         - recheck in 4 hrs or prn         - continue current treatment

## 2018-12-10 NOTE — Anesthesia Preprocedure Evaluation (Signed)
Anesthesia Evaluation  Patient identified by MRN, date of birth, ID band Patient awake    Reviewed: Allergy & Precautions, NPO status , Patient's Chart, lab work & pertinent test results  Airway Mallampati: II  TM Distance: >3 FB Neck ROM: Full    Dental no notable dental hx. (+) Teeth Intact   Pulmonary asthma , former smoker,    Pulmonary exam normal breath sounds clear to auscultation       Cardiovascular hypertension, Pt. on medications and Pt. on home beta blockers Normal cardiovascular exam Rhythm:Regular Rate:Normal  On Mg++   Neuro/Psych negative neurological ROS  negative psych ROS   GI/Hepatic negative GI ROS, Neg liver ROS,   Endo/Other  negative endocrine ROS  Renal/GU negative Renal ROS     Musculoskeletal   Abdominal (+) + obese,   Peds  Hematology Hgb 11.2 plt 184   Anesthesia Other Findings   Reproductive/Obstetrics (+) Pregnancy                             Anesthesia Physical Anesthesia Plan  ASA: III  Anesthesia Plan: Epidural   Post-op Pain Management:    Induction:   PONV Risk Score and Plan:   Airway Management Planned:   Additional Equipment:   Intra-op Plan:   Post-operative Plan:   Informed Consent: I have reviewed the patients History and Physical, chart, labs and discussed the procedure including the risks, benefits and alternatives for the proposed anesthesia with the patient or authorized representative who has indicated his/her understanding and acceptance.       Plan Discussed with:   Anesthesia Plan Comments: (35 wk G1P0 w hx of Asthma and Pre Eclampsia on Mg++ for LEA)        Anesthesia Quick Evaluation

## 2018-12-10 NOTE — Progress Notes (Signed)
Patient ID: Briana Pham, female   DOB: 23-Aug-1985, 33 y.o.   MRN: [id number]  Pt asymptomatic. Fatigued from prolonged induction. +FMs. Received another dose of stadol  Foley bulb still in place with tug.   Pitocin at 62mus EFM - 130, cat 1 TOCO - irreg contractions  134>11.2<184  Plan: Discussed continued options for ripening as follows:            Consider epidural now with continued pitocin till 30mus.             If foley still in place with pitocin at 82mus and epidural, consider stopping pitocin and then retrying cytotec.            Pt enquiring about indications for cesarean section - advised would include concerning fetal tracing, maternal decompensation remote from delivery and exhausting all options for ripening or augmentation. Procedure discussed briefly but rIsks and benefits of svd vs c/s including postpartum expectations reviewed. Pt consulted with partner and will try epidural now            Anesthesia notified.

## 2018-12-11 ENCOUNTER — Encounter (HOSPITAL_COMMUNITY): Payer: Self-pay

## 2018-12-11 LAB — CBC
HCT: 33.5 % — ABNORMAL LOW (ref 36.0–46.0)
Hemoglobin: 10.7 g/dL — ABNORMAL LOW (ref 12.0–15.0)
MCH: 28.5 pg (ref 26.0–34.0)
MCHC: 31.9 g/dL (ref 30.0–36.0)
MCV: 89.1 fL (ref 80.0–100.0)
Platelets: 192 10*3/uL (ref 150–400)
RBC: 3.76 MIL/uL — ABNORMAL LOW (ref 3.87–5.11)
RDW: 15.6 % — ABNORMAL HIGH (ref 11.5–15.5)
WBC: 14.1 10*3/uL — ABNORMAL HIGH (ref 4.0–10.5)
nRBC: 0 % (ref 0.0–0.2)

## 2018-12-11 MED ORDER — SIMETHICONE 80 MG PO CHEW: 80.0000 mg | CHEWABLE_TABLET | ORAL | Status: DC | PRN

## 2018-12-11 MED ORDER — ZOLPIDEM TARTRATE 5 MG PO TABS: 5.0000 mg | ORAL_TABLET | Freq: Every evening | ORAL | Status: DC | PRN

## 2018-12-11 MED ORDER — HYDROMORPHONE HCL 1 MG/ML IJ SOLN
1.0000 mg | Freq: Once | INTRAMUSCULAR | Status: AC
  Administered 2018-12-11: 1 mg via INTRAVENOUS
  Filled 2018-12-11: qty 1

## 2018-12-11 MED ORDER — SIMETHICONE 80 MG PO CHEW
80.0000 mg | CHEWABLE_TABLET | ORAL | Status: DC
  Administered 2018-12-11 – 2018-12-13 (×4): 80 mg via ORAL
  Filled 2018-12-11 (×4): qty 1

## 2018-12-11 MED ORDER — WITCH HAZEL-GLYCERIN EX PADS: 1.0000 "application " | MEDICATED_PAD | CUTANEOUS | Status: DC | PRN

## 2018-12-11 MED ORDER — SENNOSIDES-DOCUSATE SODIUM 8.6-50 MG PO TABS
2.0000 | ORAL_TABLET | ORAL | Status: DC
  Administered 2018-12-11 – 2018-12-13 (×4): 2 via ORAL
  Filled 2018-12-11 (×4): qty 2

## 2018-12-11 MED ORDER — PRENATAL MULTIVITAMIN CH
1.0000 | ORAL_TABLET | Freq: Every day | ORAL | Status: DC
  Administered 2018-12-11 – 2018-12-12 (×2): 1 via ORAL
  Filled 2018-12-11 (×2): qty 1

## 2018-12-11 MED ORDER — OXYTOCIN 40 UNITS IN NORMAL SALINE INFUSION - SIMPLE MED
2.5000 [IU]/h | INTRAVENOUS | Status: AC
  Administered 2018-12-11: 2.5 [IU]/h via INTRAVENOUS

## 2018-12-11 MED ORDER — OXYCODONE HCL 5 MG PO TABS
5.0000 mg | ORAL_TABLET | ORAL | Status: DC | PRN
  Administered 2018-12-11 (×3): 5 mg via ORAL
  Administered 2018-12-11 – 2018-12-13 (×10): 10 mg via ORAL
  Filled 2018-12-11 (×4): qty 2
  Filled 2018-12-11: qty 1
  Filled 2018-12-11 (×3): qty 2
  Filled 2018-12-11: qty 1
  Filled 2018-12-11 (×2): qty 2
  Filled 2018-12-11: qty 1
  Filled 2018-12-11: qty 2

## 2018-12-11 MED ORDER — MENTHOL 3 MG MT LOZG: 1.0000 | LOZENGE | OROMUCOSAL | Status: DC | PRN

## 2018-12-11 MED ORDER — COCONUT OIL OIL: 1.0000 "application " | TOPICAL_OIL | Status: DC | PRN

## 2018-12-11 MED ORDER — DIBUCAINE (PERIANAL) 1 % EX OINT: 1.0000 "application " | TOPICAL_OINTMENT | CUTANEOUS | Status: DC | PRN

## 2018-12-11 MED ORDER — ACETAMINOPHEN 500 MG PO TABS: 1000.0000 mg | ORAL_TABLET | Freq: Four times a day (QID) | ORAL | Status: DC

## 2018-12-11 MED ORDER — SIMETHICONE 80 MG PO CHEW
80.0000 mg | CHEWABLE_TABLET | Freq: Three times a day (TID) | ORAL | Status: DC
  Administered 2018-12-11 – 2018-12-14 (×10): 80 mg via ORAL
  Filled 2018-12-11 (×10): qty 1

## 2018-12-11 MED ORDER — GABAPENTIN 100 MG PO CAPS
100.0000 mg | ORAL_CAPSULE | Freq: Two times a day (BID) | ORAL | Status: DC
  Administered 2018-12-11 – 2018-12-13 (×6): 100 mg via ORAL
  Filled 2018-12-11 (×6): qty 1

## 2018-12-11 MED ORDER — FAMOTIDINE 20 MG PO TABS
20.0000 mg | ORAL_TABLET | Freq: Every day | ORAL | Status: DC
  Administered 2018-12-11 – 2018-12-12 (×2): 20 mg via ORAL
  Filled 2018-12-11 (×2): qty 1

## 2018-12-11 MED ORDER — TETANUS-DIPHTH-ACELL PERTUSSIS 5-2.5-18.5 LF-MCG/0.5 IM SUSP: 0.5000 mL | Freq: Once | INTRAMUSCULAR | Status: DC

## 2018-12-11 MED ORDER — DIPHENHYDRAMINE HCL 25 MG PO CAPS: 25.0000 mg | ORAL_CAPSULE | Freq: Four times a day (QID) | ORAL | Status: DC | PRN

## 2018-12-11 MED ORDER — LACTATED RINGERS IV SOLN
INTRAVENOUS | Status: DC
  Administered 2018-12-11 (×2): via INTRAVENOUS

## 2018-12-11 NOTE — Lactation Note (Signed)
This note was copied from a baby's chart. Lactation Consultation Note Baby 23 hrs old. LPI 35 WKS. WT. 4.14 LBS Baby has been poor feeder, had no interest in feeding or suckling. Having issues w/low temps. As well. Mom drowsy but awake and talking. Mom asked partner to listen to [facility] teaching. LPI information sheet given and reviewed.  LPI behavior, STS, covering up exposed skin d/t less than 5 lbs., importance of strict I&O, breast massage, milk storage, supply and demand discussed. Mom stated that she hand expressed some colostrum. Mom has everted nipples. Few drops of colostrum noted. talked w/mom importance of breast stimulation and pumping. Mom agreed.  Mom shown how to use DEBP & how to disassemble, clean, & reassemble parts. Mom knows to pump q3h for 15-20 min. Mom pumped w/no colostrum noted. Mom understands normal. Mom wishes to give Donor milk. CN RN is asking MD to get fortified supplement to get added to Donor milk. They gave Similac 22 cal. Mom encouraged to feed baby 8-12 times/24 hours and with feeding cues.  Will give mom consent for Donor milk to sign. Encouraged to call for assistance or questions. Asked  Mom to call for assistance in latching when baby is interested in feeding.   Patient Name: Briana Pham [id number] Date: 12/11/2018 Reason for consult: Initial assessment;Primapara;Infant < 6lbs;Late-preterm 34-36.6wks   Maternal Data Has patient been taught Hand Expression?: Yes Does the patient have breastfeeding experience prior to this delivery?: No  Feeding Feeding Type: Bottle Fed - Formula Nipple Type: Slow - flow  LATCH Score       Type of Nipple: Everted at rest and after stimulation  Comfort (Breast/Nipple): Soft / non-tender        Interventions Interventions: Breast feeding basics reviewed;DEBP;Breast massage;Hand express;Breast compression  Lactation Tools Discussed/Used Pump Review: Setup, frequency, and cleaning;Milk Storage Initiated  by:: Allayne Stack RN IBCLC Date initiated:: 12/11/18   Consult Status Consult Status: Follow-up Date: 12/11/18(in pm) Follow-up type: In-patient    Theodoro Kalata 12/11/2018, 5:42 AM

## 2018-12-11 NOTE — Progress Notes (Signed)
MOB was referred for history of depression/anxiety. * Referral screened out by Clinical Social Worker because none of the following criteria appear to apply: ~ History of anxiety/depression during this pregnancy, or of post-partum depression following prior delivery. ~ Diagnosis of anxiety and/or depression within last 3 years; no concerns noted in PNR OR * MOB's symptoms currently being treated with medication and/or therapy.  Please contact the Clinical Social Worker if needs arise, by MOB request, or if MOB scores greater than 9/yes to question 10 on Edinburgh Postpartum Depression Screen.  Briana Pham, MSW, LCSW Clinical Social Work [phone] 

## 2018-12-11 NOTE — Lactation Note (Addendum)
This note was copied from a baby's chart. Lactation Consultation Note  Patient Name: Briana Pham Date: 12/11/2018   Bottle feeding attempted with extra-slow flow nipple. Infant is very poor feeder (even in side-lying inclined with chin support). I did not observe any swallows & saw little to no effort on behalf of infant, who has not fed in 4 hrs. The non-gestational mother also shared that she observed the same (lack of) effort with previous bottle feedings. She said it takes about 30 minutes for infant to take 5-6 mL.   When I removed bottle from infant's mouth, a trickle of the fortified donor milk came out of infant's mouth, which the non-gestational mother said she had also seen with the last bottle feeding. Other bottle feedings have taken place with the Enfamil slow flow nipple.  I came to nursery to share my concern with Briana Cornea, RN. She says she already has a call Dr. Suzan Slick. Briana Mourning, RN will share my concerns.    1245: Briana Andrea, RN & I spoke with Dr. Suzan Slick. She is calling the NICU.  Matthias Hughs [facility] 12/11/2018, 12:32 PM

## 2018-12-11 NOTE — Anesthesia Postprocedure Evaluation (Signed)
Anesthesia Post Note  [name]  Procedure(s) Performed: CESAREAN SECTION (N/A )     Patient location during evaluation: PACU Anesthesia Type: Epidural Level of consciousness: awake Pain management: pain level controlled Vital Signs Assessment: post-procedure vital signs reviewed and stable Respiratory status: spontaneous breathing Cardiovascular status: stable Postop Assessment: no headache, no backache, epidural receding, no apparent nausea or vomiting and patient able to bend at knees Anesthetic complications: no    Last Vitals:  Vitals:   12/10/18 2315 12/11/18 0009  BP: 130/83 (!) 144/94  Pulse: 74 64  Resp: 17 18  Temp: 36.6 C 36.6 C  SpO2: 96% 94%    Last Pain:  Vitals:   12/11/18 0009  TempSrc: Oral  PainSc:    Pain Goal:                   Huston Foley

## 2018-12-11 NOTE — Progress Notes (Signed)
POSTPARTUM POSTOP PROGRESS NOTE  POD #1  Subjective:  No acute events overnight.  Pt has not yet ambulated, no issues voiding or w/ scant po intake.  She denies nausea or vomiting.  Pain is well controlled.  She has not had flatus. She has not had bowel movement.  Lochia Small. Denies PreE symptoms this AM, just feels fatigued with long induction and MgSO4. Desires circ for [facility] but aware she needs to pay first and baby must be cleared from [facility] standpoint.   Objective: Blood pressure 129/85, pulse (!) 56, temperature 97.6 F (36.4 C), temperature source Oral, resp. rate 18, height 5\' 7"  (1.702 m), weight 112.9 kg, SpO2 94 %, unknown if currently breastfeeding.  Physical Exam:  General: alert, cooperative and no distress, Appears sleepy 2/2 late procedure Lochia:normal flow Chest: CTAB Heart: RRR no m/r/g Abdomen: +BS, soft, nontender Uterine Fundus: firm, 2cm below umbilicus. Honeycomb dressing intact, minimal drainage Extremities: neg edema, neg calf TTP BL, neg Homans BL  Recent Labs    12/10/18 1915 12/11/18 0534  HGB 11.4* 10.7*  HCT 34.7* 33.5*    Assessment/Plan:  ASSESSMENT: Briana Pham is a 33 y.o. G1P0101 s/p [facility] @ [redacted]w[redacted]d for arrest of dilation. PNC c/b PreE w/ SF, IVF preg, AMA and HTN on Rx.  1) Postop -routine care desires hospital circ, needs to pay -pumping  2) PreE w/ SF -Continue MgSO4 until 2315 tonight -BP currently 129/85, 140s earlier in evening -Continue labetalol 200mg  BID -UOP appropriate  Breastfeeding, Lactation consult and Circumcision prior to discharge   LOS: 3 days

## 2018-12-12 LAB — SURGICAL PATHOLOGY

## 2018-12-12 MED ORDER — PANTOPRAZOLE SODIUM 40 MG PO TBEC
40.0000 mg | DELAYED_RELEASE_TABLET | Freq: Every day | ORAL | Status: DC
  Administered 2018-12-12 – 2018-12-13 (×2): 40 mg via ORAL
  Filled 2018-12-12 (×2): qty 1

## 2018-12-12 MED ORDER — NIFEDIPINE ER OSMOTIC RELEASE 30 MG PO TB24
30.0000 mg | ORAL_TABLET | Freq: Once | ORAL | Status: AC
  Administered 2018-12-12: 30 mg via ORAL
  Filled 2018-12-12: qty 1

## 2018-12-12 MED ORDER — IBUPROFEN 600 MG PO TABS
600.0000 mg | ORAL_TABLET | Freq: Four times a day (QID) | ORAL | Status: DC
  Administered 2018-12-12 – 2018-12-14 (×7): 600 mg via ORAL
  Filled 2018-12-12 (×7): qty 1

## 2018-12-12 MED ORDER — ACETAMINOPHEN 325 MG PO TABS
650.0000 mg | ORAL_TABLET | Freq: Four times a day (QID) | ORAL | Status: DC | PRN
  Administered 2018-12-12 – 2018-12-13 (×2): 650 mg via ORAL
  Filled 2018-12-12 (×2): qty 2

## 2018-12-12 NOTE — Progress Notes (Signed)
Patient ID: Briana Pham, female   DOB: 02/09/1985, 33 y.o.   MRN: [id number] BP trending up a bit throughout day, will add procardia XL 30mg  po qhs

## 2018-12-12 NOTE — Lactation Note (Signed)
This note was copied from a baby's chart. Lactation Consultation Note  Patient Name: Briana Pham [id number] Date: 12/12/2018   Baby 103 hours old in NICU.  IVF. Mother states she has pumped x 2 with small amount of colostrum expressed. Encouraged mother to continue pumping q 2-2.5 hours during the day and q 4 hours at night for a minimum of 8 times in a 24 hours peroid. Mother has Medela DEBP at home. Discussed pumping at bedside in NICU and converting pump kit once home. Referred mother to hands on pumping video.       Maternal Data    Feeding Feeding Type: Donor Breast Milk  LATCH Score                   Interventions    Lactation Tools Discussed/Used     Consult Status      Carlye Grippe 12/12/2018, 9:51 AM

## 2018-12-12 NOTE — Progress Notes (Signed)
Subjective: Postpartum Day 2: Cesarean Delivery Patient reports incisional pain.  She is passing flatus and voiding well.  Baby stable in the NICU, on tube feeds  Objective: Vital signs in last 24 hours: Temp:  [97.7 F (36.5 C)-98.5 F (36.9 C)] 98.4 F (36.9 C) (12/08 0906) Pulse Rate:  [58-75] 73 (12/08 0906) Resp:  [16-18] 18 (12/08 0906) BP: (100-152)/(59-89) 152/85 (12/08 0906) SpO2:  [93 %-100 %] 97 % (12/08 0906)  Physical Exam:  General: alert and cooperative Lochia: appropriate Uterine Fundus: firm, abdomen slightly distended Incision:  Dressing intact with small amount of dried blood DVT Evaluation: SCD's in place  Recent Labs    12/10/18 1915 12/11/18 0534  HGB 11.4* 10.7*  HCT 34.7* 33.5*    Assessment/Plan: Status post Cesarean section. Doing well postoperatively.  BP stable on 200mg  po of labetalol BID and magnesium d/c last PM, will follow throughout day for potential rebound increase Encouraged ambulation Bottlefeeding  Logan Bores 12/12/2018, 9:17 AM

## 2018-12-13 LAB — CBC
HCT: 30 % — ABNORMAL LOW (ref 36.0–46.0)
Hemoglobin: 10 g/dL — ABNORMAL LOW (ref 12.0–15.0)
MCH: 29.3 pg (ref 26.0–34.0)
MCHC: 33.3 g/dL (ref 30.0–36.0)
MCV: 88 fL (ref 80.0–100.0)
Platelets: 205 10*3/uL (ref 150–400)
RBC: 3.41 MIL/uL — ABNORMAL LOW (ref 3.87–5.11)
RDW: 15.5 % (ref 11.5–15.5)
WBC: 13.3 10*3/uL — ABNORMAL HIGH (ref 4.0–10.5)
nRBC: 0 % (ref 0.0–0.2)

## 2018-12-13 MED ORDER — NIFEDIPINE ER OSMOTIC RELEASE 30 MG PO TB24
60.0000 mg | ORAL_TABLET | Freq: Every day | ORAL | Status: DC
  Administered 2018-12-13 – 2018-12-14 (×2): 60 mg via ORAL
  Filled 2018-12-13 (×3): qty 2

## 2018-12-13 NOTE — Lactation Note (Signed)
This note was copied from a baby's chart. Lactation Consultation Note  Patient Name: Briana Pham [id number] Date: 12/13/2018   Attempted to visit with mothers who are not in room at this time.      Maternal Data    Feeding Feeding Type: Donor Breast Milk  LATCH Score                   Interventions    Lactation Tools Discussed/Used     Consult Status      Carlye Grippe 12/13/2018, 9:05 AM

## 2018-12-13 NOTE — Progress Notes (Signed)
Patient ID: Briana Pham, female   DOB: 03/08/1985, 33 y.o.   MRN: [id number]   Was going to go to see pt to discuss possible discharge. Pt's BP relatively well-controlled 140's  Pt walked to NICU, on return felt "not right" Had HA.  CBC checked and stable  Will monitor overnight.likely d/c in AM.

## 2018-12-13 NOTE — Progress Notes (Signed)
Patient ID: Briana Pham, female   DOB: 08/28/1985, 33 y.o.   MRN: [id number] Blood pressures reviewed and still elevated overnight Procardia XL increased to 60mg  with dosing scheduled for 0800am

## 2018-12-13 NOTE — Progress Notes (Signed)
Subjective: Postpartum Day 3: Cesarean Delivery Patient reports incisional pain, tolerating PO and no problems voiding.  Some BP instability - On labetalol 200mg  bid, Procardia 60bid  Objective: Vital signs in last 24 hours: Temp:  [97.9 F (36.6 C)-98.5 F (36.9 C)] 97.9 F (36.6 C) (12/09 0453) Pulse Rate:  [70-86] 72 (12/09 0453) Resp:  [18-20] 18 (12/09 0453) BP: (147-177)/(81-110) 147/81 (12/09 0453) SpO2:  [95 %-98 %] 98 % (12/09 0453)  Physical Exam:  General: alert and no distress Lochia: appropriate Uterine Fundus: firm Incision: healing well DVT Evaluation: No evidence of DVT seen on physical exam.  Recent Labs    12/10/18 1915 12/11/18 0534  HGB 11.4* 10.7*  HCT 34.7* 33.5*    Assessment/Plan: Status post Cesarean section. Doing well postoperatively.  Continue current care possible d/c this PM.  Briana Pham 12/13/2018, 8:25 AM

## 2018-12-14 MED ORDER — OXYCODONE-ACETAMINOPHEN 5-325 MG PO TABS: 1.0000 | ORAL_TABLET | ORAL | 0 refills | Status: AC | PRN

## 2018-12-14 MED ORDER — IBUPROFEN 600 MG PO TABS: 600.0000 mg | ORAL_TABLET | Freq: Four times a day (QID) | ORAL | 1 refills | Status: DC | PRN

## 2018-12-14 MED ORDER — NIFEDIPINE ER 60 MG PO TB24: 60.0000 mg | ORAL_TABLET | Freq: Every day | ORAL | 1 refills | Status: DC

## 2018-12-14 MED ORDER — LABETALOL HCL 200 MG PO TABS: 200.0000 mg | ORAL_TABLET | Freq: Two times a day (BID) | ORAL | 1 refills | Status: DC

## 2018-12-14 NOTE — Plan of Care (Signed)
  Problem: Education: Goal: Knowledge of Childbirth will improve Outcome: Completed/Met Goal: Ability to make informed decisions regarding treatment and plan of care will improve Outcome: Completed/Met Goal: Ability to state and carry out methods to decrease the pain will improve Outcome: Completed/Met Goal: Individualized Educational Video(s) Outcome: Completed/Met   Problem: Coping: Goal: Ability to verbalize concerns and feelings about labor and delivery will improve Outcome: Completed/Met   Problem: Life Cycle: Goal: Ability to make normal progression through stages of labor will improve Outcome: Completed/Met   Problem: Role Relationship: Goal: Will demonstrate positive interactions with the child Outcome: Completed/Met   Problem: Safety: Goal: Risk of complications during labor and delivery will decrease Outcome: Completed/Met   Problem: Pain Management: Goal: Relief or control of pain from uterine contractions will improve Outcome: Completed/Met   Problem: Education: Goal: Knowledge of General Education information will improve Description: Including pain rating scale, medication(s)/side effects and non-pharmacologic comfort measures Outcome: Completed/Met

## 2018-12-14 NOTE — Lactation Note (Signed)
This note was copied from a baby's chart. Lactation Consultation Note  Patient Name: Briana Pham [id number] Date: 12/14/2018   P1, Baby 51 hours old.  Mothers in NICU with baby. Mother states she has not pumped today and was getting drops yesterday. Encouraged her to pump q 2-3 hours during the day and q 4 hours at night. Mother watched hands on pumping video and has 2 DEBP at home. Encouraged her to pump at bedside. Reviewed engorgement care.      Maternal Data    Feeding Feeding Type: Donor Breast Milk  LATCH Score                   Interventions    Lactation Tools Discussed/Used     Consult Status      Carlye Grippe 12/14/2018, 8:59 AM

## 2018-12-14 NOTE — Progress Notes (Signed)
Subjective: Postpartum Day 4: Cesarean Delivery Patient reports tolerating PO, + flatus and no problems voiding.Pain well controlled. Had HA after being too active yesterday - none since. Lochia mild. Baby doing well in NICU. Ready for discharge to home today   Objective: Vital signs in last 24 hours: Temp:  [97.9 F (36.6 C)-98.5 F (36.9 C)] 98.2 F (36.8 C) (12/10 0842) Pulse Rate:  [72-86] 85 (12/10 0842) Resp:  [18] 18 (12/10 0548) BP: (137-148)/(80-91) 143/91 (12/10 0842) SpO2:  [99 %-100 %] 100 % (12/10 0548)  Physical Exam:  General: alert, cooperative and no distress Lochia: appropriate Uterine Fundus: firm Incision: Old blood on dressing  DVT Evaluation: No evidence of DVT seen on physical exam.  Recent Labs    12/13/18 1628  HGB 10.0*  HCT 30.0*    Assessment/Plan: Status post Cesarean section. Doing well postoperatively.  Discharge home with standard precautions and return to clinic in 1week for BP check, 2weeks for incision check and 6 weeks for postpartum visit.  Isaiah Serge 12/14/2018, 10:25 AM

## 2018-12-14 NOTE — Discharge Summary (Signed)
OB Discharge Summary     Patient Name: Briana Pham DOB: September 10, 1985 MRN: [id number]  Date of admission: 12/08/2018 Delivering MD: Pryor Ochoa [facility]   Date of discharge: 12/14/2018  Admitting diagnosis: High BP Intrauterine pregnancy: [redacted]w[redacted]d     Secondary diagnosis:  Active Problems:   Preeclampsia, severe, third trimester   Arrest of dilation, delivered, current hospitalization  Additional problems: S/P cesarean section     Discharge diagnosis: Term Pregnancy Delivered and Preeclampsia (severe)                                                                                                Post partum procedures:none  Augmentation: Pitocin, Cytotec and Foley Balloon  Complications: None  Hospital course:  Induction of Labor With Cesarean Section  33 y.o. yo G1P0101 at [redacted]w[redacted]d was admitted to the hospital 12/08/2018 for induction of labor. Patient had a labor course significant for prolonged latent phase . The patient went for cesarean section due to Arrest of Dilation, and delivered a Viable infant,12/10/2018  Membrane Rupture Time/Date:  ,   Details of operation can be found in separate operative Note.  Patient had an uncomplicated postpartum course. She is ambulating, tolerating a regular diet, passing flatus, and urinating well.  Patient is discharged home in stable condition on 12/14/18.                                    Physical exam  Vitals:   12/13/18 2002 12/13/18 2355 12/14/18 0548 12/14/18 0842  BP: (!) 141/80 140/84 (!) 148/88 (!) 143/91  Pulse: 80 79 72 85  Resp: 18 18 18    Temp: 98.3 F (36.8 C) 98.3 F (36.8 C) 97.9 F (36.6 C) 98.2 F (36.8 C)  TempSrc: Oral Oral Oral Oral  SpO2: 100% 100% 100%   Weight:      Height:       General: alert, cooperative and no distress Lochia: appropriate Uterine Fundus: firm Incision: No significant erythema DVT Evaluation: No evidence of DVT seen on physical exam. Labs: Lab Results  Component Value Date   WBC  13.3 (H) 12/13/2018   HGB 10.0 (L) 12/13/2018   HCT 30.0 (L) 12/13/2018   MCV 88.0 12/13/2018   PLT 205 12/13/2018   CMP Latest Ref Rng & Units 12/08/2018  Glucose 70 - 99 mg/dL 14/04/2018)  BUN 6 - 20 mg/dL 8  Creatinine 631(S - 9.70 mg/dL 2.63  Sodium 7.85 - 885 mmol/L 137  Potassium 3.5 - 5.1 mmol/L 4.4  Chloride 98 - 111 mmol/L 107  CO2 22 - 32 mmol/L 20(L)  Calcium 8.9 - 10.3 mg/dL 9.3  Total Protein 6.5 - 8.1 g/dL 6.1(L)  Total Bilirubin 0.3 - 1.2 mg/dL 027)  Alkaline Phos 38 - 126 U/L 118  AST 15 - 41 U/L 32  ALT 0 - 44 U/L 27    Discharge instruction: per After Visit Summary and "Baby and Me Booklet".  After visit meds:  Allergies as of 12/14/2018      Reactions  Azithromycin Anaphylaxis, Itching   Rash, crawly skin, could not form sentences Other reaction(s): Confusion   Penicillins Hives, Itching   Confusion Other reaction(s): Confusion      Medication List    TAKE these medications   albuterol (2.5 MG/3ML) 0.083% nebulizer solution Commonly known as: PROVENTIL Take 2.5 mg by nebulization as needed for wheezing or shortness of breath.   ibuprofen 600 MG tablet Commonly known as: ADVIL Take 1 tablet (600 mg total) by mouth every 6 (six) hours as needed for moderate pain or cramping.   labetalol 200 MG tablet Commonly known as: NORMODYNE Take 1 tablet (200 mg total) by mouth 2 (two) times daily.   NIFEdipine 60 MG 24 hr tablet Commonly known as: ADALAT CC Take 1 tablet (60 mg total) by mouth daily. Start taking on: December 15, 2018   oxyCODONE-acetaminophen 5-325 MG tablet Commonly known as: Percocet Take 1 tablet by mouth every 4 (four) hours as needed for up to 7 days for severe pain.   prenatal multivitamin Tabs tablet Take 1 tablet by mouth daily at 12 noon.            Discharge Care Instructions  (From admission, onward)         Start     Ordered   12/14/18 0000  Discharge wound care:    Comments: Change dressing prior to discharge;  keep on for a week. May shower, pat dry   12/14/18 1032   12/14/18 0000  Change dressing     12/14/18 1032          Diet: routine diet  Activity: Advance as tolerated. Pelvic rest for 6 weeks.   Outpatient follow up: 1week for BP check, 2weeks for incision check and 6 weeks for postpartum vis Follow up Appt:No future appointments. Follow up Visit:No follow-ups on file.  Postpartum contraception: Not Discussed  Newborn Data: Live born female  Birth Weight: 4 lb 14 oz (2210 g) APGAR: 47, 9  Newborn Delivery   Birth date/time: 12/10/2018 21:40:00 Delivery type: C-Section, Vacuum Assisted Trial of labor: Yes C-section categorization: Primary      Baby Feeding: Bottle and Breast Disposition:NICU   12/14/2018 Isaiah Serge, DO

## 2018-12-14 NOTE — Progress Notes (Signed)
Discharge instructions given to patient. Discussed medication changes to labetalol, procardia, percocet and prenatal vitamin. Reviewed signs and symptoms of hypertension and postpartum cesarean care. Discussed follow up appointments. Patient verbalized understanding.

## 2018-12-14 NOTE — Discharge Instructions (Signed)
Call office with any concerns [phone] 

## 2018-12-16 ENCOUNTER — Ambulatory Visit: Payer: Self-pay

## 2018-12-16 NOTE — Lactation Note (Signed)
This note was copied from a baby's chart. Lactation Consultation Note  Patient Name: Boy Vieva Brummitt [id number] Date: 12/16/2018 Reason for consult: Follow-up assessment;Primapara;NICU baby  Mom reports that her milk came to volume yesterday morning at 0200. Since then, she has been pumping 30+ - 65 mL with each pumping session. Mom pumped 6-7 times yesterday.  Mom reports having small "knots", which were palpated in the upper outer quadrant of each breast. I observed her pumping and noted that although Mom was comfortable, the size 24 flanges were bringing in a little bit too much areola. Once that pumping session had ended, hand expression was taught to Mom & she was able to repeat on her own. She was very pleased. Mom found that the "knots" in the upper outer quadrants had decreased in size.   Once her nipple/areolar complex decreased in size, we tried the size 21 flanges. Mom found greater comfort in the size 21 flanges. I instructed Mom that she may, over time, need to increase flange sizes as she continues to pump & her nipple size when pumping increases. Mom continued to pump some, as she found that she was able to get out more with the 21 flanges.  I reminded parents to wash pump parts after each use & to sanitize pump parts at home once/day.     Matthias Hughs [facility] 12/16/2018, 12:28 PM

## 2018-12-17 ENCOUNTER — Ambulatory Visit: Payer: Self-pay

## 2018-12-17 NOTE — Lactation Note (Signed)
This note was copied from a baby's chart. Lactation Consultation Note  Patient Name: Briana Pham [id number] Date: 12/17/2018 Reason for consult: NICU baby  Infant is 40 days old. I checked in with Mom & partner to see how things were since my visit yesterday.   Mom is very pleased with how things are going. She is consistently pumping 2.5-3 oz with pumping & is comfortable with the size 21 flanges. She has used a heating pad on her outer quadrants prn with good results.  I provided Mom & partner with supplies to wash pump parts while visiting their infant.   Briana Pham [facility] 12/17/2018, 11:14 AM

## 2018-12-20 ENCOUNTER — Ambulatory Visit: Payer: Self-pay

## 2018-12-20 NOTE — Lactation Note (Signed)
This note was copied from a baby's chart. Lactation Consultation Note  Patient Name: Briana Pham [id number] Date: 12/20/2018   [facility] visited mom and partner.  Mom is happy with progress with pumping.  Mom feels things are going very well and does not have any questions regarding pumping.  Baby is about to eat but mom will not be bf for this feed.  She did say infant latched and fed on and off for 2-3 minutes which was better than previous attempt.  She did say it was painful when "kraven" was latched.  LC reviewed importance of correct positioning, using pillows, and hand exp. Prior to latching.    LC encouraged mom to notify RN when she desired to BF and wanted LC to be present in order to observe a feed attempt.    Mom and partner very happy and denied further questions/needs at this time. Maternal Data    Feeding Feeding Type: Breast Milk  LATCH Score Latch: Repeated attempts needed to sustain latch, nipple held in mouth throughout feeding, stimulation needed to elicit sucking reflex.  Audible Swallowing: A few with stimulation  Type of Nipple: Everted at rest and after stimulation  Comfort (Breast/Nipple): Soft / non-tender  Hold (Positioning): No assistance needed to correctly position infant at breast.  LATCH Score: 8  Interventions    Lactation Tools Discussed/Used     Consult Status      Ferne Coe [facility] 12/20/2018, 2:16 PM

## 2018-12-21 ENCOUNTER — Ambulatory Visit: Payer: Self-pay

## 2018-12-21 NOTE — Lactation Note (Signed)
This note was copied from a baby's chart. Lactation Consultation Note  Patient Name: Briana Pham [id number] Date: 12/21/2018 Reason for consult: Mother's request;Follow-up assessment;NICU baby;Late-preterm 34-36.6wks  Asked by NICU RN to visit with mom  LC entered room to find mom and partner at bedside, baby asleep in partner's lap; mom pumping and bottles appear nearly full. Mom states she has got off schedule with her pumping today in relation to baby's feeding times. Mom states baby is tongue tied and has pain when baby latches. Mom states previous LC suggested nipple shield and mom wants to try tomorrow with shield. Mom denies any pain or issues with pumping. Mom states she soon will need to freeze her milk due to excessive supply. Praised mom for her efforts and dedication. Mom and partner in good spirits and very pleased with success with pumping. Encouraged to call with any further needs.  Consult Status Consult Status: PRN Follow-up type: In-patient    Cranston Neighbor 12/21/2018, 5:17 PM

## 2018-12-24 ENCOUNTER — Ambulatory Visit: Payer: Self-pay

## 2018-12-24 ENCOUNTER — Encounter (HOSPITAL_COMMUNITY): Payer: 59

## 2018-12-24 NOTE — Lactation Note (Signed)
This note was copied from a baby's chart. Lactation Consultation Note  Patient Name: Briana Pham [id number] Date: 12/24/2018 Reason for consult: Follow-up assessment;Difficult latch;1st time breastfeeding;NICU baby;Primapara;Infant < 6lbs  Consult in NICU.  Baby 49 weeks old, AGA [redacted]w[redacted]d.  Mom has a greast milk supply, pumping 6 oz per pumping session.  Mom has been trying to breastfeed baby and complaining of severe pain.  Baby has a lingual frenulum which appears thin.  Baby's tongue elevates and extends out of mouth.    Initiated a 20 mm nipple shield, showing Mom how to properly apply by turning shield partly inside out. The goal is to have her nipple pulled well into shield.    Assisted with positioning in football hold on left breast.  Baby opened his mouth just enough for nipple shield.  Assisted Mom to support her breast at base of breast, compressing breast when baby in sucking burst.  Baby fed on and off for 15 mins, needing stimulation to keep sucking.  A few swallows noted.  Nipple shield filled with milk at end of feeding.    Mom to have nurse call when baby starts cueing for next feeding.  Mom reminded to pump after breastfeeding.  Mom return demonstrated how to apply the nipple shield.     Feeding Feeding Type: Breast Fed  LATCH Score Latch: Repeated attempts needed to sustain latch, nipple held in mouth throughout feeding, stimulation needed to elicit sucking reflex.  Audible Swallowing: A few with stimulation  Type of Nipple: Everted at rest and after stimulation  Comfort (Breast/Nipple): Soft / non-tender  Hold (Positioning): Assistance needed to correctly position infant at breast and maintain latch.  LATCH Score: 7  Interventions Interventions: Breast feeding basics reviewed;Assisted with latch;Skin to skin;Breast massage;Hand express;Pre-pump if needed;Breast compression;Adjust position;Support pillows;Position options;Expressed milk;DEBP  Lactation Tools  Discussed/Used Tools: Nipple [employer] Fuel;Pump Nipple shield size: 20 Breast pump type: Double-Electric Breast Pump   Consult Status Consult Status: Follow-up Date: 12/24/18 Follow-up type: In-patient    Broadus John 12/24/2018, 4:02 PM

## 2018-12-26 ENCOUNTER — Ambulatory Visit: Payer: Self-pay

## 2018-12-26 NOTE — Lactation Note (Addendum)
This note was copied from a baby's chart. Lactation Consultation Note  Patient Name: Briana Pham [id number] Date: 12/26/2018   Baby in NICU, now 45 weeks old.  [redacted]w[redacted]d GA.  Mother is aware of tight lingual frenulum but have not discussed with MD.  Suggest discussing it with Peds MD.   Mother is latching using #20NS.  Baby recently bf for 5 min and then was supplementing with bottle of breastmilk because baby has been sleepy. Observed pumping with #21 flanges, sides are rubbing but mother states it is not uncomfortable on side, only on tips. She will try #24 flanges (flanges are at home) since her nipples have increased in size and can decide what works best. Mother has been pumping between 7-9 oz of breastmilk.  Praised her for her efforts. Provided mother with #20 & #24 nipple shield.  Discussed as baby grows and nipples change size, she can increase size. Suggest calling for LC to observe feeding as needed.       Maternal Data    Feeding Feeding Type: Breast Milk Nipple Type: Nfant Extra Slow Flow (gold)  LATCH Score Latch: Repeated attempts needed to sustain latch, nipple held in mouth throughout feeding, stimulation needed to elicit sucking reflex.  Audible Swallowing: Spontaneous and intermittent  Type of Nipple: Everted at rest and after stimulation  Comfort (Breast/Nipple): Soft / non-tender  Hold (Positioning): Assistance needed to correctly position infant at breast and maintain latch.  LATCH Score: 8  Interventions    Lactation Tools Discussed/Used     Consult Status      Briana Pham [facility] 12/26/2018, 11:53 AM

## 2019-01-02 ENCOUNTER — Ambulatory Visit: Payer: Self-pay

## 2019-01-02 NOTE — Lactation Note (Signed)
This note was copied from a baby's chart. Lactation Consultation Note  Patient Name: Briana Pham [id number] Date: 01/02/2019 Reason for consult: Follow-up assessment;Primapara;1st time breastfeeding;Early term 37-38.6wks;Other (Comment)(D/C today)  Baby is 30 weeks old .  As LC entered the room, baby was latched with #20 NS and the baby accommodated well, milk in the NS when baby released.  The #20 NS appeared borderline snug when the baby released .  LC offered to size mom for the #24 NS and baby was awake enough to assess  Latch for about 5 mins , milk in the base of the NS. Baby accommodated the base well.  Mom mentioned she has had some engorgement, but knows what to do when it happens, pumping with #21 F and it hurts and is snug.  LC recommended to increase flange size to the #24 F.  LC offered to request and LC O/P appt for next week and mom receptive.  LC praised mom for her all her efforts breast feeding her baby and pumping.    Maternal Data    Feeding Feeding Type: Breast Fed  LATCH Score                   Interventions Interventions: Breast feeding basics reviewed  Lactation Tools Discussed/Used Tools: Nipple Shields Nipple shield size: 24;20(sized for NS) Flange Size: 21;24(per mom discomfort with #21 F - LC recommended to increase to the #24 F) Breast pump type: Double-Electric Breast Pump   Consult Status Consult Status: Follow-up Follow-up type: [place] 01/02/2019, 12:14 PM

## 2019-12-04 IMAGING — US US MFM OB DETAIL+14 WK
1 series · 13 of 28 positions shown · non-contrast
Comparison: none

[Series 1: us mfm ob detail+14 wk · 54 acquisitions, 13 frames shown]
[im 2/54]
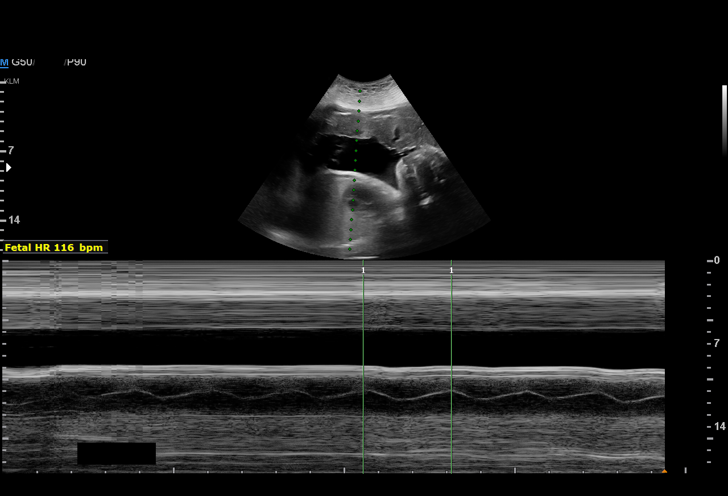
[im 6/54]
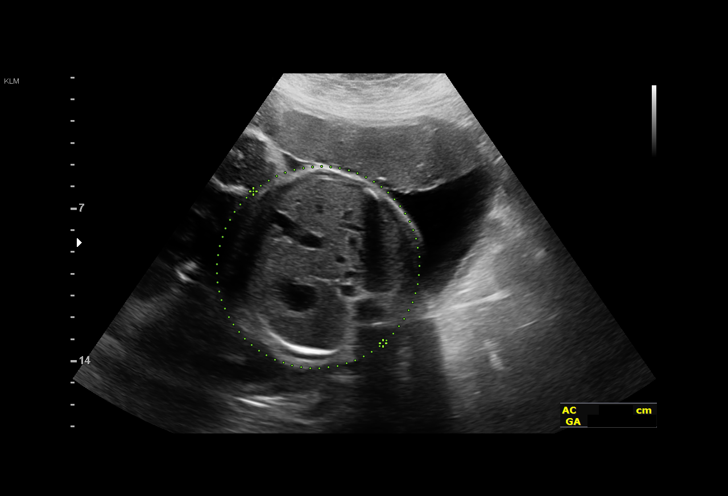
[im 10/54]
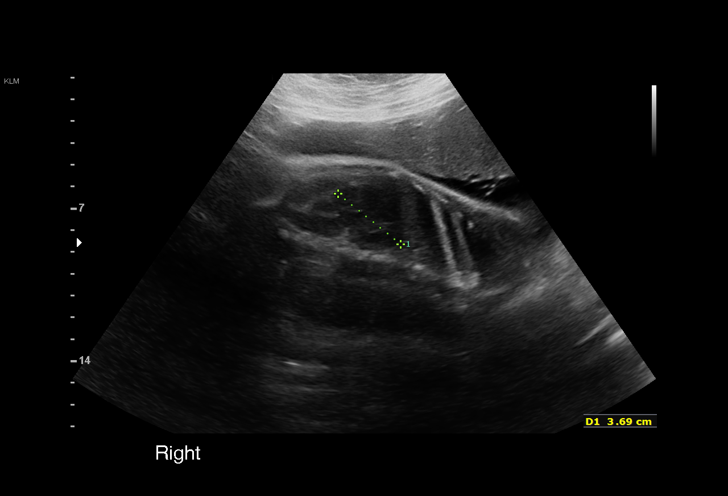
[im 14/54]
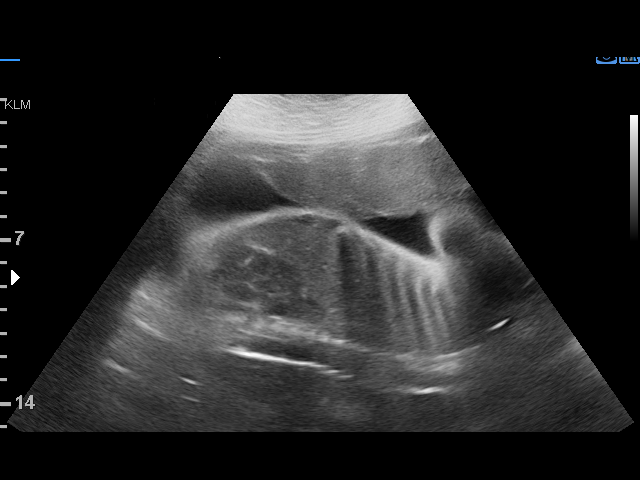
[im 18/54]
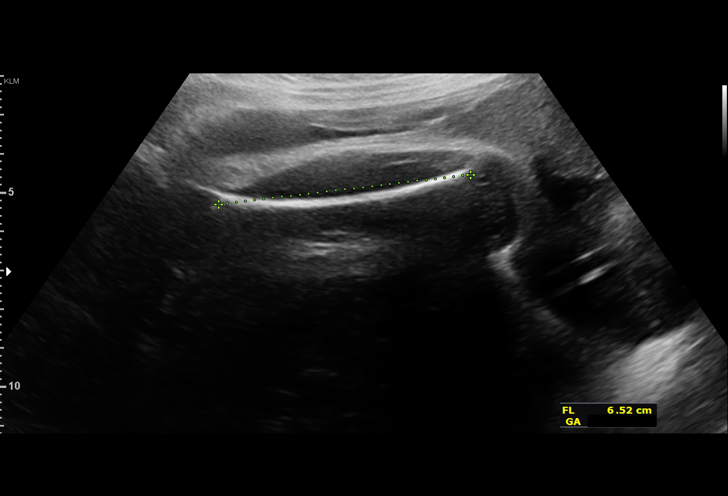
[im 22/54]
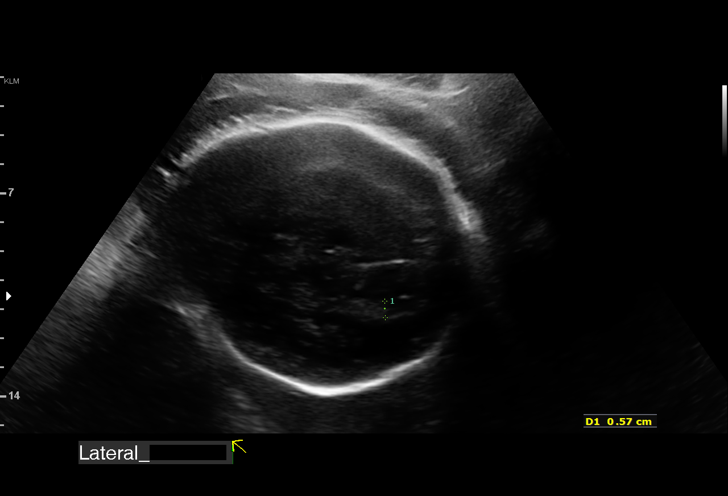
[im 28/54]
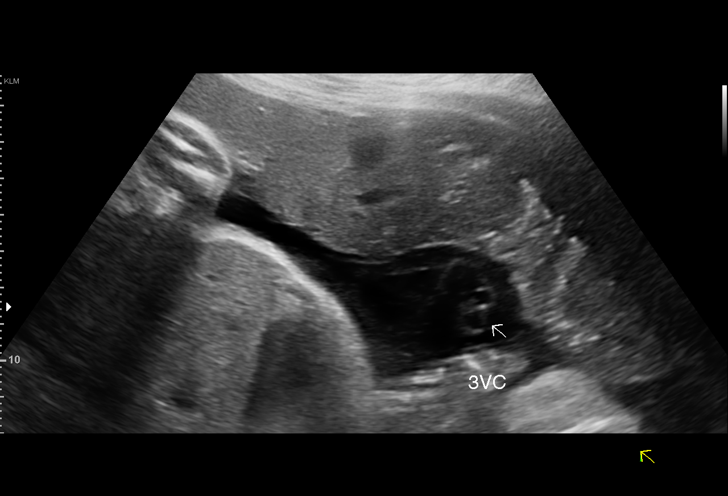
[im 32/54]
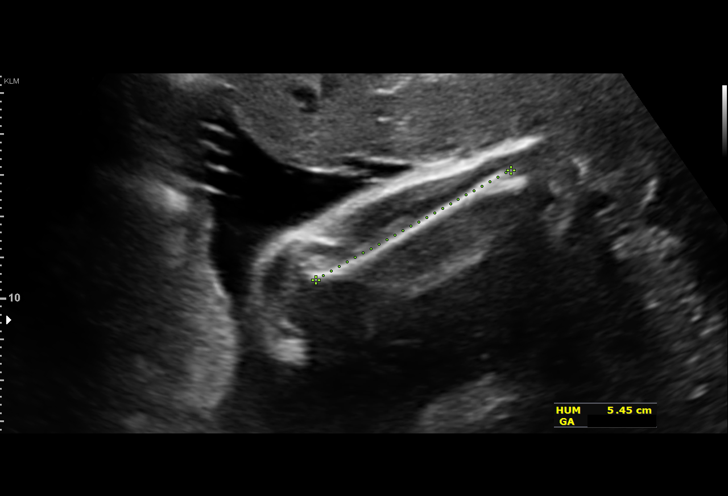
[im 36/54]
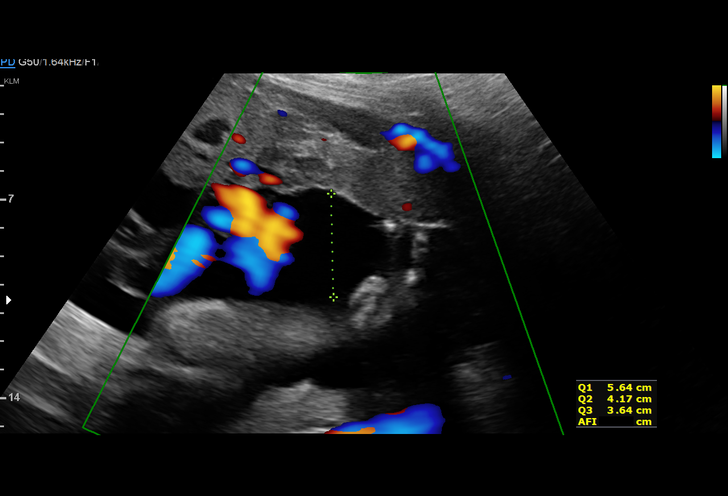
[im 40/54]
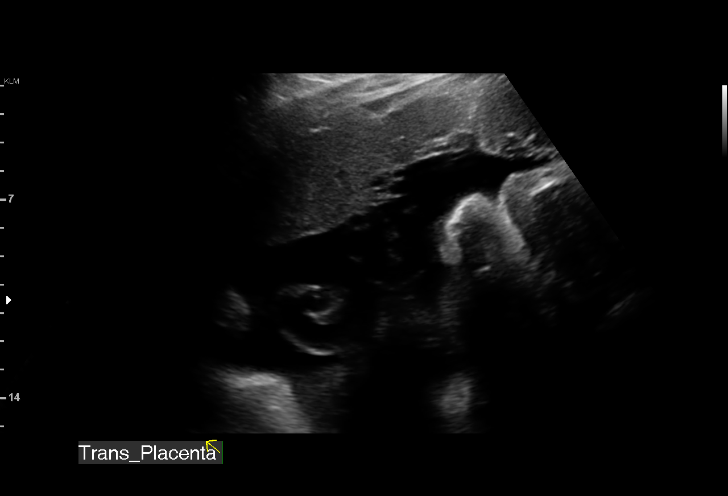
[im 44/54]
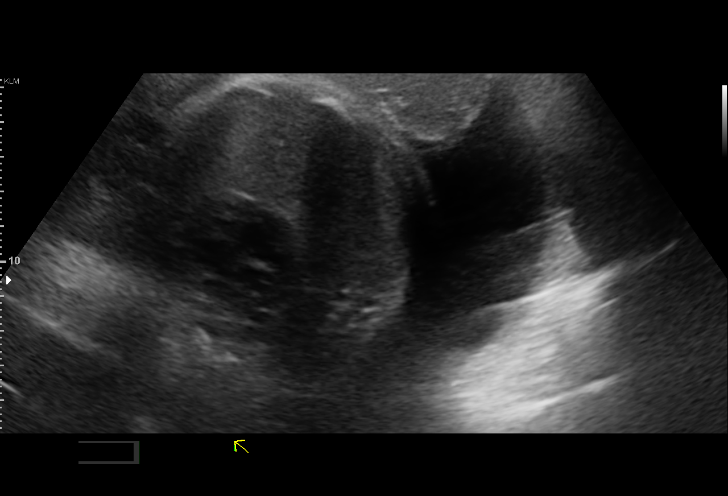
[im 48/54]
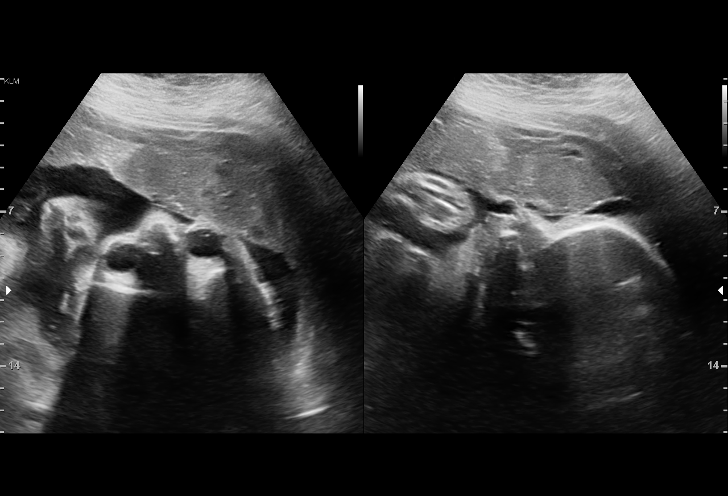
[im 52/54]
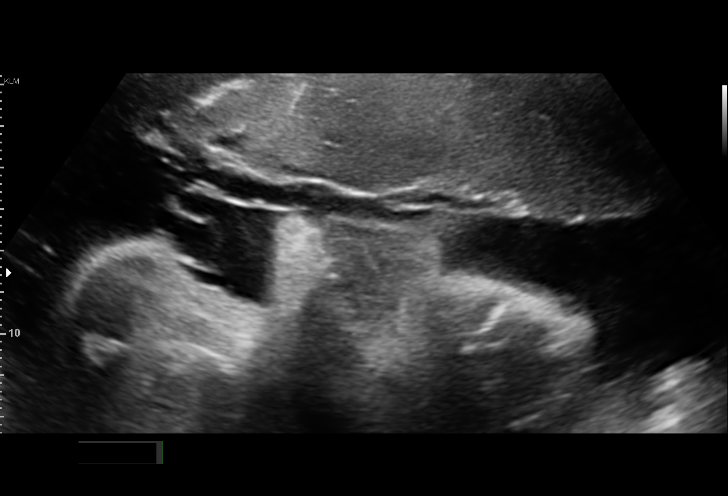

[13 of 28 positions shown; findings below may reference images not displayed]

[HOSPITAL],
                                                            Inc.
 Attending:        Takako Parvin        Secondary Phy.:   [facility]
                                                            [facility]
 Referred By:      EMIN                  [facility]                            [HOSPITAL]

 ----------------------------------------------------------------------

 ----------------------------------------------------------------------
Indications

  Pre-eclampsia
  34 weeks gestation of pregnancy
  Encounter for antenatal screening for
  malformations
 ----------------------------------------------------------------------
Fetal Evaluation

 Num Of Fetuses:         1
 Fetal Heart Rate(bpm):  144
 Cardiac Activity:       Observed
 Presentation:           Cephalic
 Placenta:               Anterior
 P. Cord Insertion:      Visualized

 Amniotic Fluid
 AFI FV:      Within normal limits

 AFI Sum(cm)     %Tile       Largest Pocket(cm)
 18.79           70

 RUQ(cm)       RLQ(cm)       LUQ(cm)        LLQ(cm)

Biophysical Evaluation
 Amniotic F.V:   Within normal limits       F. Tone:        Observed
 F. Movement:    Observed                   Score:          [DATE]
 F. Breathing:   Observed
Biometry

 BPD:      87.2  mm     G. Age:  35w 1d         69  %    CI:        73.33   %    70 - 86
                                                         FL/HC:      20.1   %    20.1 -
 HC:      323.6  mm     G. Age:  36w 4d         67  %    HC/AC:      1.11        0.93 -
 AC:      291.8  mm     G. Age:  33w 1d         18  %    FL/BPD:     74.8   %    71 - 87
 FL:       65.2  mm     G. Age:  33w 4d         19  %    FL/AC:      22.3   %    20 - 24
 HUM:      58.2  mm     G. Age:  33w 5d         46  %

 Est. FW:    6698  gm      5 lb 1 oz     25  %
OB History

 Gravidity:    1         Term:   0        Prem:   0        SAB:   0
 TOP:          0       Ectopic:  0        Living: 0
Gestational Age

 U/S Today:     34w 4d                                        EDD:   01/14/19
 Best:          34w 4d     Det. By:  Early Ultrasound         EDD:   01/14/19
Anatomy

 Cranium:               Appears normal         Aortic Arch:            Not well visualized
 Cavum:                 Appears normal         Ductal Arch:            Appears normal
 Ventricles:            Appears normal         Diaphragm:              Appears normal
 Choroid Plexus:        Not well visualized    Stomach:                Appears normal, left
                                                                       sided
 Cerebellum:            Not well visualized    Abdomen:                Appears normal
 Posterior Fossa:       Not well visualized    Abdominal Wall:         Not well visualized
 Nuchal Fold:           Not applicable (>20    Cord Vessels:           Appears normal (3
                        wks GA)                                        vessel cord)
 Face:                  Appears normal         Kidneys:                Appear normal
                        (orbits and profile)
 Lips:                  Appears normal         Bladder:                Appears normal
 Thoracic:              Appears normal         Spine:                  Not well visualized
 Heart:                 Not well visualized    Upper Extremities:      Visualized
 RVOT:                  Appears normal         Lower Extremities:      Visualized
 LVOT:                  Not well visualized

 Other:  Technically difficult due to advanced GA and fetal position.
Cervix Uterus Adnexa

 Cervix
 Not visualized (advanced GA >30wks)
Impression

 diagnosis of preeclampsia.  She has some severe range
 blood pressures that were not persistently in the severe
 range.

 On ultrasound, fetal growth is appropriate for gestational age.
 Amniotic fluid is normal and good fetal activity seen.  Fetal
 anatomical survey appears normal, but limited by advanced
 gestational age.  Antenatal testing is reassuring.  BPP [DATE].

 If severe range blood pressures recur, I recommend delivery
 at diagnosis.  Patient has preeclampsia with mild features,
 she should be delivered at 37 weeks.
Recommendations

 -Continue weekly BPP till delivery.
                 Higareda, Kellen

## 2020-12-09 DIAGNOSIS — Z3490 Encounter for supervision of normal pregnancy, unspecified, unspecified trimester: Secondary | ICD-10-CM | POA: Insufficient documentation

## 2021-04-02 ENCOUNTER — Ambulatory Visit: Payer: 59 | Admitting: Podiatry

## 2021-04-02 ENCOUNTER — Encounter: Payer: Self-pay | Admitting: Podiatry

## 2021-04-02 ENCOUNTER — Ambulatory Visit: Payer: 59

## 2021-04-02 DIAGNOSIS — M67471 Ganglion, right ankle and foot: Secondary | ICD-10-CM | POA: Diagnosis not present

## 2021-04-02 DIAGNOSIS — M778 Other enthesopathies, not elsewhere classified: Secondary | ICD-10-CM

## 2021-04-02 NOTE — Progress Notes (Signed)
?  Subjective:  ?Patient ID: Briana Pham, female    DOB: 02-11-1985,  MRN: [id number] ?HPI ?Chief Complaint  ?Patient presents with  ? Foot Pain  ?  Dorsal midfoot/anterior ankle right - injury x 1 year ago, knot, xrayed at [facility] had spur, on IVF treatments   ? New Patient (Initial Visit)  ? ? ?36 y.o. female presents with the above complaint.  ? ?ROS: Denies fever chills nausea vomiting muscle aches pains calf pain back pain chest pain shortness of breath. ? ?Past Medical History:  ?Diagnosis Date  ? Anxiety   ? Arrest of dilation, delivered, current hospitalization 12/10/2018  ? Asthma   ? inhaler used in 2017  ? Insomnia   ? Mental disorder   ? ?Past Surgical History:  ?Procedure Laterality Date  ? CESAREAN SECTION N/A 12/10/2018  ? Procedure: CESAREAN SECTION;  Surgeon: Edwinna Areola, DO;  Location: [facility] LD [facility]S;  Service: Obstetrics;  Laterality: N/A;  ? HYSTEROSCOPY    ? ivf    ? MOUTH SURGERY    ? WISDOM TOOTH EXTRACTION    ? ? ?Current Outpatient Medications:  ?  ESTROGENS CONJUGATED PO, Take by mouth., Disp: , Rfl:  ?  Prenat MV-Min w/Fe-Folate-DHA (PRENATAL COMPLETE PO), Take by mouth., Disp: , Rfl:  ?  estradiol (ESTRACE) 2 MG tablet, Take 2 mg by mouth 2 (two) times daily., Disp: , Rfl:  ? ?Allergies  ?Allergen Reactions  ? Azithromycin Anaphylaxis and Itching  ?  Rash, crawly skin, could not form sentences ?Other reaction(s): Confusion  ? Penicillins Hives and Itching  ?  Confusion ?Other reaction(s): Confusion  ? ?Review of Systems ?Objective:  ?There were no vitals filed for this visit. ? ?General: Well developed, nourished, in no acute distress, alert and oriented x3  ? ?Dermatological: Skin is warm, dry and supple bilateral. Nails x 10 are well maintained; remaining integument appears unremarkable at this time. There are no open sores, no preulcerative lesions, no rash or signs of infection present. ? ?Vascular: Dorsalis Pedis artery and Posterior Tibial artery pedal pulses are  2/4 bilateral with immedate capillary fill time. Pedal hair growth present. No varicosities and no lower extremity edema present bilateral.  ? ?Neruologic: Grossly intact via light touch bilateral. Vibratory intact via tuning fork bilateral. Protective threshold with Semmes Wienstein monofilament intact to all pedal sites bilateral. Patellar and Achilles deep tendon reflexes 2+ bilateral. No Babinski or clonus noted bilateral.  ? ?Musculoskeletal: No gross boney pedal deformities bilateral. No pain, crepitus, or limitation noted with foot and ankle range of motion bilateral. Muscular strength 5/5 in all groups tested bilateral.  Nonpulsatile firm nodule dorsal aspect of the right foot once evacuated demonstrated dorsal spurring at the second third tarsometatarsal joint which was palpable. ? ?Gait: Unassisted, Nonantalgic.  ? ? ?Radiographs: ? ?None taken due to possible pregnancy ? ?Assessment & Plan:  ? ?Assessment: Dorsal tarsal exostosis with ganglion ? ?Plan: Aspiration of ganglion today revealed bloody synovial type fluid approximately a cc.  On top of the blood.  I will follow-up with her when she has completed her fertility and pregnancy. ? ? ? ? ?Tajai Suder T. Munson, DPM ?

## 2021-06-18 LAB — OB RESULTS CONSOLE ANTIBODY SCREEN: Antibody Screen: NEGATIVE

## 2021-06-18 LAB — OB RESULTS CONSOLE HIV ANTIBODY (ROUTINE TESTING): HIV: NONREACTIVE

## 2021-06-18 LAB — OB RESULTS CONSOLE RUBELLA ANTIBODY, IGM: Rubella: IMMUNE

## 2021-06-18 LAB — OB RESULTS CONSOLE GC/CHLAMYDIA
Chlamydia: NEGATIVE
Neisseria Gonorrhea: NEGATIVE

## 2021-06-18 LAB — OB RESULTS CONSOLE ABO/RH: RH Type: POSITIVE

## 2021-06-18 LAB — HEPATITIS C ANTIBODY: HCV Ab: NEGATIVE

## 2021-06-18 LAB — OB RESULTS CONSOLE RPR: RPR: NONREACTIVE

## 2021-06-18 LAB — OB RESULTS CONSOLE HEPATITIS B SURFACE ANTIGEN: Hepatitis B Surface Ag: NEGATIVE

## 2021-07-07 ENCOUNTER — Inpatient Hospital Stay (HOSPITAL_COMMUNITY)
Admission: AD | Admit: 2021-07-07 | Discharge: 2021-07-07 | Disposition: A | Discharge status: Home / Self Care | Payer: 59 | Attending: Obstetrics and Gynecology | Admitting: Obstetrics and Gynecology

## 2021-07-07 ENCOUNTER — Encounter (HOSPITAL_COMMUNITY): Payer: Self-pay

## 2021-07-07 ENCOUNTER — Other Ambulatory Visit: Payer: Self-pay

## 2021-07-07 DIAGNOSIS — Z3A13 13 weeks gestation of pregnancy: Secondary | ICD-10-CM | POA: Diagnosis not present

## 2021-07-07 DIAGNOSIS — Z3491 Encounter for supervision of normal pregnancy, unspecified, first trimester: Secondary | ICD-10-CM

## 2021-07-07 DIAGNOSIS — O99611 Diseases of the digestive system complicating pregnancy, first trimester: Secondary | ICD-10-CM

## 2021-07-07 DIAGNOSIS — K219 Gastro-esophageal reflux disease without esophagitis: Secondary | ICD-10-CM

## 2021-07-07 LAB — COMPREHENSIVE METABOLIC PANEL
ALT: 11 U/L (ref 0–44)
AST: 14 U/L — ABNORMAL LOW (ref 15–41)
Albumin: 3.1 g/dL — ABNORMAL LOW (ref 3.5–5.0)
Alkaline Phosphatase: 93 U/L (ref 38–126)
Anion gap: 10 (ref 5–15)
BUN: 8 mg/dL (ref 6–20)
CO2: 21 mmol/L — ABNORMAL LOW (ref 22–32)
Calcium: 9.1 mg/dL (ref 8.9–10.3)
Chloride: 105 mmol/L (ref 98–111)
Creatinine, Ser: 0.69 mg/dL (ref 0.44–1.00)
GFR, Estimated: >60 mL/min (ref >60)
Glucose, Bld: 90 mg/dL (ref 70–99)
Potassium: 3.9 mmol/L (ref 3.5–5.1)
Sodium: 136 mmol/L (ref 135–145)
Total Bilirubin: 0.3 mg/dL (ref 0.3–1.2)
Total Protein: 7.3 g/dL (ref 6.5–8.1)

## 2021-07-07 LAB — CBC
HCT: 36.1 % (ref 36.0–46.0)
Hemoglobin: 11.7 g/dL — ABNORMAL LOW (ref 12.0–15.0)
MCH: 27.1 pg (ref 26.0–34.0)
MCHC: 32.4 g/dL (ref 30.0–36.0)
MCV: 83.6 fL (ref 80.0–100.0)
Platelets: 276 10*3/uL (ref 150–400)
RBC: 4.32 MIL/uL (ref 3.87–5.11)
RDW: 13.8 % (ref 11.5–15.5)
WBC: 6.5 10*3/uL (ref 4.0–10.5)
nRBC: 0 % (ref 0.0–0.2)

## 2021-07-07 LAB — URINALYSIS, ROUTINE W REFLEX MICROSCOPIC
Bilirubin Urine: NEGATIVE
Glucose, UA: NEGATIVE mg/dL
Hgb urine dipstick: NEGATIVE
Ketones, ur: 80 mg/dL — AB
Leukocytes,Ua: NEGATIVE
Nitrite: NEGATIVE
Protein, ur: NEGATIVE mg/dL
Specific Gravity, Urine: 1.027 (ref 1.005–1.030)
pH: 5 (ref 5.0–8.0)

## 2021-07-07 MED ORDER — OMEPRAZOLE 20 MG PO CPDR: 20.0000 mg | DELAYED_RELEASE_CAPSULE | Freq: Two times a day (BID) | ORAL | 5 refills | Status: DC

## 2021-07-07 MED ORDER — LACTATED RINGERS IV BOLUS
1000.0000 mL | Freq: Once | INTRAVENOUS | Status: AC
  Administered 2021-07-07: 1000 mL via INTRAVENOUS

## 2021-07-07 MED ORDER — FAMOTIDINE IN NACL 20-0.9 MG/50ML-% IV SOLN
20.0000 mg | Freq: Once | INTRAVENOUS | Status: AC
  Administered 2021-07-07: 20 mg via INTRAVENOUS
  Filled 2021-07-07: qty 50

## 2021-07-07 MED ORDER — ONDANSETRON HCL 4 MG/2ML IJ SOLN
4.0000 mg | Freq: Once | INTRAMUSCULAR | Status: AC
  Administered 2021-07-07: 4 mg via INTRAVENOUS
  Filled 2021-07-07: qty 2

## 2021-07-07 NOTE — Discharge Instructions (Signed)
Take omeprazole twice daily, can also take TUMS before bedtime.

## 2021-07-07 NOTE — MAU Note (Signed)
Briana Pham is a 36 y.o. at [redacted]w[redacted]d here in MAU reporting: states has been sick this pregnancy and has lost 24 pounds. Has been seen in the office but is not on any medications because she is "stubborn". Ongoing abdominal pain and back pain. It is worse today. No emesis in the past 24 hours.  Onset of complaint: ongoing  Pain score: abdomen 6/10, back 4/10 Vitals:   07/07/21 1322  BP: 123/86  Pulse: 100  Resp: 16  Temp: 99 F (37.2 C)     FHT:145  Lab orders placed from triage: ua

## 2021-07-07 NOTE — MAU Provider Note (Signed)
Chief Complaint:  Nausea, Abdominal Pain, and Back Pain   Event Date/Time   First Provider Initiated Contact with Patient 07/07/21 1357     HPI: Briana Pham is a 36 y.o. [id number] at [redacted]w[redacted]d who presents to maternity admissions reporting stomach pain with occasional nighttime vomiting (wakes up feeling like she is choking, then vomits). Has not been eating much this pregnancy because her stomach always hurts and gets worse when she eats. Can tolerate nuts and fruits well but nothing else (including water). Was having low back pain earlier today but it has resolved. Denies vaginal bleeding or discharge. No other physical complaints.  Pregnancy Course: Receives care at [facility]. Reports she has had an ultrasound showing a viable pregnancy with no complications.  Past Medical History:  Diagnosis Date   Anxiety    Arrest of dilation, delivered, current hospitalization 12/10/2018   Asthma    inhaler used in 2017   Insomnia    Mental disorder    OB History  Gravida Para Term Preterm AB Living  3 1   1 1 1   SAB IAB Ectopic Multiple Live Births  1     0 1    # Outcome Date GA Lbr Len/2nd Weight Sex Delivery Anes PTL Lv  3 Current           2 SAB 2022          1 Preterm 12/10/18 [redacted]w[redacted]d  4 lb 14 oz (2.21 kg) M CS-Vac EPI  LIV   Past Surgical History:  Procedure Laterality Date   CESAREAN SECTION N/A 12/10/2018   Procedure: CESAREAN SECTION;  Surgeon: 14/06/2018, DO;  Location: [facility] LD [facility]S;  Service: Obstetrics;  Laterality: N/A;   HYSTEROSCOPY     ivf     MOUTH SURGERY     WISDOM TOOTH EXTRACTION     Family History  Problem Relation Age of Onset   Multiple sclerosis Mother    Heart disease Father    Stroke Father    Social History   Tobacco Use   Smoking status: Former    Years: 9.00    Types: Cigarettes   Smokeless tobacco: Never  Vaping Use   Vaping Use: Never used  Substance Use Topics   Alcohol use: No   Drug use: No   Allergies  Allergen Reactions    Azithromycin Anaphylaxis and Itching    Rash, crawly skin, could not form sentences Other reaction(s): Confusion   Penicillins Hives and Itching    Confusion Other reaction(s): Confusion ALL CILLIN FAMILY    Medications Prior to Admission  Medication Sig Dispense Refill Last Dose   estradiol (ESTRACE) 2 MG tablet Take 2 mg by mouth 2 (two) times daily.      ESTROGENS CONJUGATED PO Take by mouth.      Prenat MV-Min w/Fe-Folate-DHA (PRENATAL COMPLETE PO) Take by mouth.      I have reviewed patient's Past Medical Hx, Surgical Hx, Family Hx, Social Hx, medications and allergies.   ROS:  Pertinent items noted in HPI and remainder of comprehensive ROS otherwise negative.  Physical Exam  Patient Vitals for the past 24 hrs:  BP Temp Temp src Pulse Resp Height Weight  07/07/21 1520 128/68 -- -- 74 -- -- --  07/07/21 1327 -- -- -- -- -- 5\' 7"  (1.702 m) 219 lb 14.4 oz (99.7 kg)  07/07/21 1322 123/86 99 F (37.2 C) Oral 100 16 -- --   Constitutional: Well-developed, well-nourished female in no acute  distress.  Cardiovascular: normal rate & rhythm Respiratory: normal effort GI: Abd soft, non-tender MS: Extremities nontender, no edema, normal ROM Neurologic: Alert and oriented x 4.  GU: no CVA tenderness Pelvic: exam deferred, no cramping/bleeding/discharge  FHT: 145   Labs: Results for orders placed or performed during the hospital encounter of 07/07/21 (from the past 24 hour(s))  CBC     Status: Abnormal   Collection Time: 07/07/21  1:55 PM  Result Value Ref Range   WBC 6.5 4.0 - 10.5 K/uL   RBC 4.32 3.87 - 5.11 MIL/uL   Hemoglobin 11.7 (L) 12.0 - 15.0 g/dL   HCT 42.5 95.6 - 38.7 %   MCV 83.6 80.0 - 100.0 fL   MCH 27.1 26.0 - 34.0 pg   MCHC 32.4 30.0 - 36.0 g/dL   RDW 56.4 33.2 - 95.1 %   Platelets 276 150 - 400 K/uL   nRBC 0.0 0.0 - 0.2 %  Comprehensive metabolic panel     Status: Abnormal   Collection Time: 07/07/21  1:55 PM  Result Value Ref Range   Sodium 136 135 -  145 mmol/L   Potassium 3.9 3.5 - 5.1 mmol/L   Chloride 105 98 - 111 mmol/L   CO2 21 (L) 22 - 32 mmol/L   Glucose, Bld 90 70 - 99 mg/dL   BUN 8 6 - 20 mg/dL   Creatinine, Ser 8.84 0.44 - 1.00 mg/dL   Calcium 9.1 8.9 - 16.6 mg/dL   Total Protein 7.3 6.5 - 8.1 g/dL   Albumin 3.1 (L) 3.5 - 5.0 g/dL   AST 14 (L) 15 - 41 U/L   ALT 11 0 - 44 U/L   Alkaline Phosphatase 93 38 - 126 U/L   Total Bilirubin 0.3 0.3 - 1.2 mg/dL   GFR, Estimated >06 >30 mL/min   Anion gap 10 5 - 15  Urinalysis, Routine w reflex microscopic Urine, Clean Catch     Status: Abnormal   Collection Time: 07/07/21  2:02 PM  Result Value Ref Range   Color, Urine AMBER (A) YELLOW   APPearance CLOUDY (A) CLEAR   Specific Gravity, Urine 1.027 1.005 - 1.030   pH 5.0 5.0 - 8.0   Glucose, UA NEGATIVE NEGATIVE mg/dL   Hgb urine dipstick NEGATIVE NEGATIVE   Bilirubin Urine NEGATIVE NEGATIVE   Ketones, ur 80 (A) NEGATIVE mg/dL   Protein, ur NEGATIVE NEGATIVE mg/dL   Nitrite NEGATIVE NEGATIVE   Leukocytes,Ua NEGATIVE NEGATIVE   Imaging:  No results found.  MAU Course: Orders Placed This Encounter  Procedures   Urinalysis, Routine w reflex microscopic Urine, Clean Catch   CBC   Comprehensive metabolic panel   Insert peripheral IV   Discharge patient   Meds ordered this encounter  Medications   ondansetron (ZOFRAN) injection 4 mg   lactated ringers bolus 1,000 mL   famotidine (PEPCID) IVPB 20 mg premix   omeprazole (PRILOSEC) 20 MG capsule    Sig: Take 1 capsule (20 mg total) by mouth 2 (two) times daily at 8 am and 10 pm.    Dispense:  50 capsule    Refill:  5    Order Specific Question:   Supervising Provider    Answer:   Samara Snide   MDM: Initially reporting nausea so LR bolus and zofran ordered. Pt then clarified that she feels pain and discomfort when she eats. She is always hungry but eating causes pain and then she has woken up to vomit 3x since  being pregnant. Has lost 24lbs due to this pain.  Pepcid added to regimen which relieved pain and she tolerated ice chips well.  Discussed regimen of an antacid BID and can add TUMS at night if needed. Pt amenable to plan and stable for discharge home. Also reviewed importance of starting with very light meals (nothing dairy, acidic or greasy) and advancing as tolerated.  Assessment: 1. Gastroesophageal reflux during pregnancy in first trimester, antepartum   2. [redacted] weeks gestation of pregnancy   3. Fetal heart tones present, first trimester    Plan: Discharge home in stable condition with return precautions    Follow-up Information     [facility] Follow up.   Why: as scheduled for ongoing prenatal care Contact information: [address]  [address] [place] [address] [phone]                 Allergies as of 07/07/2021       Reactions   Azithromycin Anaphylaxis, Itching   Rash, crawly skin, could not form sentences Other reaction(s): Confusion   Penicillins Hives, Itching   Confusion Other reaction(s): Confusion ALL CILLIN FAMILY         Medication List     STOP taking these medications    estradiol 2 MG tablet Commonly known as: ESTRACE   ESTROGENS CONJUGATED PO       TAKE these medications    omeprazole 20 MG capsule Commonly known as: PRILOSEC Take 1 capsule (20 mg total) by mouth 2 (two) times daily at 8 am and 10 pm.   PRENATAL COMPLETE PO Take by mouth.       Edd Arbour, CNM, MSN, IBCLC Certified Nurse Midwife, [facility]

## 2021-12-18 ENCOUNTER — Encounter (HOSPITAL_COMMUNITY): Payer: Self-pay

## 2021-12-18 NOTE — Patient Instructions (Signed)
SERA HITSMAN  12/18/2021   Your procedure is scheduled on:  01/01/2022  Arrive at 0530 at Entrance C on [address] at [facility] ([address]  and [employer]. You are invited to use the FREE valet parking or use the Visitor's parking deck.  Pick up the phone at the desk and dial [phone].  Call this number if you have problems the morning of surgery: [phone]  Remember:   Do not eat food:(After Midnight) Desps de medianoche.  Do not drink clear liquids: (After Midnight) Desps de medianoche.  Take these medicines the morning of surgery with A SIP OF WATER:  none   Do not wear jewelry, make-up or nail polish.  Do not wear lotions, powders, or perfumes. Do not wear deodorant.  Do not shave 48 hours prior to surgery.  Do not bring valuables to the hospital.  [facility] is not   responsible for any belongings or valuables brought to the hospital.  Contacts, dentures or bridgework may not be worn into surgery.  Leave suitcase in the car. After surgery it may be brought to your room.  For patients admitted to the hospital, checkout time is 11:00 AM the day of              discharge.      Please read over the following fact sheets that you were given:     Preparing for Surgery

## 2021-12-30 ENCOUNTER — Encounter (HOSPITAL_COMMUNITY)
Admission: RE | Admit: 2021-12-30 | Discharge: 2021-12-30 | Disposition: A | Discharge status: Home / Self Care | Payer: 59 | Source: Ambulatory Visit | Attending: Obstetrics and Gynecology | Admitting: Obstetrics and Gynecology

## 2021-12-30 DIAGNOSIS — Z3A39 39 weeks gestation of pregnancy: Secondary | ICD-10-CM

## 2021-12-30 DIAGNOSIS — Z01818 Encounter for other preprocedural examination: Secondary | ICD-10-CM | POA: Insufficient documentation

## 2021-12-30 HISTORY — DX: Gestational (pregnancy-induced) hypertension without significant proteinuria, unspecified trimester: O13.9

## 2021-12-30 LAB — CBC
HCT: 36.2 % (ref 36.0–46.0)
Hemoglobin: 11.4 g/dL — ABNORMAL LOW (ref 12.0–15.0)
MCH: 25.9 pg — ABNORMAL LOW (ref 26.0–34.0)
MCHC: 31.5 g/dL (ref 30.0–36.0)
MCV: 82.1 fL (ref 80.0–100.0)
Platelets: 327 10*3/uL (ref 150–400)
RBC: 4.41 MIL/uL (ref 3.87–5.11)
RDW: 16.6 % — ABNORMAL HIGH (ref 11.5–15.5)
WBC: 10.3 10*3/uL (ref 4.0–10.5)
nRBC: 0 % (ref 0.0–0.2)

## 2021-12-30 LAB — TYPE AND SCREEN
ABO/RH(D): O POS
Antibody Screen: NEGATIVE

## 2021-12-30 LAB — RPR: RPR Ser Ql: NONREACTIVE

## 2021-12-31 NOTE — H&P (Signed)
Briana Pham is a 36 y.o. [id number] female presenting at 60 1/7wks for scheduled repeat cesarean section. She is dated per  FET transfer done on 04/22/21. She is AMA and had a PLTC/S for failed IOL at 35 weeks for preE. Maintained on asa this pregnancy History of asthma in childhood Had BPP weekly due to FGR- had intermittently elevated dopplers  EFW at 36 weeks showed 6lbs 6oz, AGA 45%ile  She is GBS pos; clinda susceptible. MaterniT expected range OB History     Gravida  3   Para  1   Term      Preterm  1   AB  1   Living  1      SAB  1   IAB      Ectopic      Multiple  0   Live Births  1          Past Medical History:  Diagnosis Date   Anxiety    Arrest of dilation, delivered, current hospitalization 12/10/2018   Asthma    inhaler used in 2017   Insomnia    Mental disorder    Pregnancy induced hypertension    Past Surgical History:  Procedure Laterality Date   CESAREAN SECTION N/A 12/10/2018   Procedure: CESAREAN SECTION;  Surgeon: Edwinna Areola, DO;  Location: [facility] LD ORS;  Service: Obstetrics;  Laterality: N/A;   HYSTEROSCOPY     ivf     MOUTH SURGERY     WISDOM TOOTH EXTRACTION     Family History: family history includes Heart disease in her father; Multiple sclerosis in her mother; Stroke in her father. Social History:  reports that she has quit smoking. Her smoking use included cigarettes. She has never used smokeless tobacco. She reports that she does not drink alcohol and does not use drugs.     Maternal Diabetes: No Genetic Screening: Normal Maternal Ultrasounds/Referrals: Normal Fetal Ultrasounds or other Referrals:  None Maternal Substance Abuse:  No Significant Maternal Medications:  None Significant Maternal Lab Results:  Group B Strep positive Number of Prenatal Visits:greater than 3 verified prenatal visits Other Comments:  None  Review of Systems  Constitutional:  Positive for activity change and fatigue.  Eyes:   Negative for photophobia and visual disturbance.  Respiratory:  Negative for chest tightness and shortness of breath.   Cardiovascular:  Positive for leg swelling. Negative for chest pain and palpitations.  Gastrointestinal:  Negative for abdominal pain.  Genitourinary:  Negative for pelvic pain.  Musculoskeletal:  Negative for back pain.  Neurological:  Negative for light-headedness and headaches.  Psychiatric/Behavioral:  The patient is not nervous/anxious.    Maternal Medical History:  Reason for admission: Repeat cesarean section   Contractions: Frequency: rare.   Perceived severity is mild.   Fetal activity: Perceived fetal activity is normal.   Prenatal complications: no prenatal complications Prenatal Complications - Diabetes: none.     unknown if currently breastfeeding. Maternal Exam:  Uterine Assessment: Contraction strength is mild.  Contraction frequency is rare.  Abdomen: Patient reports generalized tenderness.  Estimated fetal weight is AGA.   Fetal presentation: vertex Introitus: Normal vulva. Vulva is negative for condylomata and lesion.  Normal vagina.  Vagina is negative for condylomata.  Pelvis: of concern for delivery.   Cervix: Cervix evaluated by digital exam.     Fetal Exam Fetal Monitor Review: Baseline rate: 140.  Variability: moderate (6-25 bpm).   Pattern: accelerations present.   Fetal State Assessment: Category I -  tracings are normal.   Physical Exam Vitals and nursing note reviewed. Exam conducted with a chaperone present.  Constitutional:      Appearance: Normal appearance. She is normal weight.  Cardiovascular:     Rate and Rhythm: Normal rate.  Pulmonary:     Effort: Pulmonary effort is normal.  Abdominal:     Tenderness: There is generalized abdominal tenderness.  Genitourinary:    General: Normal vulva.  Vulva is no lesion.  Musculoskeletal:        General: Normal range of motion.     Cervical back: Normal range of motion.   Skin:    General: Skin is warm and dry.     Capillary Refill: Capillary refill takes 2 to 3 seconds.  Neurological:     General: No focal deficit present.     Mental Status: She is alert and oriented to person, place, and time. Mental status is at baseline.     Prenatal labs: ABO, Rh: --/--/O POS (12/27 0913) Antibody: NEG (12/27 0913) Rubella: Immune (06/15 0000) RPR: NON REACTIVE (12/27 0908)  HBsAg: Negative (06/15 0000)  HIV: Non-reactive (06/15 0000)  GBS:     Assessment/Plan: 33HL [id number] female here for scheduled repeat cesarean section - Admit  - ERAS protocol  - Confirm consent  - To OR when ready    [name] W Assad Harbeson 12/31/2021, 10:58 PM

## 2022-01-01 ENCOUNTER — Other Ambulatory Visit: Payer: Self-pay

## 2022-01-01 ENCOUNTER — Encounter (HOSPITAL_COMMUNITY)
Admission: RE | Disposition: A | Discharge status: Home / Self Care | Payer: Self-pay | Source: Ambulatory Visit | Attending: Obstetrics and Gynecology

## 2022-01-01 ENCOUNTER — Inpatient Hospital Stay (HOSPITAL_COMMUNITY): Payer: 59 | Admitting: Anesthesiology

## 2022-01-01 ENCOUNTER — Inpatient Hospital Stay (HOSPITAL_COMMUNITY)
Admission: RE | Admit: 2022-01-01 | Discharge: 2022-01-03 | DRG: 787 | Disposition: A | Discharge status: Home / Self Care | Payer: 59 | Source: Ambulatory Visit | Attending: Obstetrics and Gynecology | Admitting: Obstetrics and Gynecology

## 2022-01-01 ENCOUNTER — Encounter (HOSPITAL_COMMUNITY): Payer: Self-pay | Admitting: Obstetrics and Gynecology

## 2022-01-01 DIAGNOSIS — O9081 Anemia of the puerperium: Secondary | ICD-10-CM | POA: Diagnosis not present

## 2022-01-01 DIAGNOSIS — Z3A39 39 weeks gestation of pregnancy: Secondary | ICD-10-CM

## 2022-01-01 DIAGNOSIS — O34211 Maternal care for low transverse scar from previous cesarean delivery: Principal | ICD-10-CM | POA: Diagnosis present

## 2022-01-01 DIAGNOSIS — O34219 Maternal care for unspecified type scar from previous cesarean delivery: Secondary | ICD-10-CM | POA: Diagnosis present

## 2022-01-01 DIAGNOSIS — Z87891 Personal history of nicotine dependence: Secondary | ICD-10-CM | POA: Diagnosis not present

## 2022-01-01 DIAGNOSIS — Z3490 Encounter for supervision of normal pregnancy, unspecified, unspecified trimester: Secondary | ICD-10-CM

## 2022-01-01 DIAGNOSIS — O365930 Maternal care for other known or suspected poor fetal growth, third trimester, not applicable or unspecified: Secondary | ICD-10-CM | POA: Diagnosis present

## 2022-01-01 DIAGNOSIS — O9902 Anemia complicating childbirth: Secondary | ICD-10-CM

## 2022-01-01 DIAGNOSIS — O99824 Streptococcus B carrier state complicating childbirth: Secondary | ICD-10-CM | POA: Diagnosis present

## 2022-01-01 DIAGNOSIS — O99214 Obesity complicating childbirth: Secondary | ICD-10-CM | POA: Diagnosis present

## 2022-01-01 DIAGNOSIS — D62 Acute posthemorrhagic anemia: Secondary | ICD-10-CM | POA: Diagnosis not present

## 2022-01-01 DIAGNOSIS — D649 Anemia, unspecified: Secondary | ICD-10-CM

## 2022-01-01 SURGERY — Surgical Case
Anesthesia: Spinal

## 2022-01-01 MED ORDER — FENTANYL CITRATE (PF) 100 MCG/2ML IJ SOLN
INTRAMUSCULAR | Status: AC
  Filled 2022-01-01: qty 2

## 2022-01-01 MED ORDER — POVIDONE-IODINE 10 % EX SWAB
2.0000 | Freq: Once | CUTANEOUS | Status: AC
  Administered 2022-01-01: 2 via TOPICAL

## 2022-01-01 MED ORDER — OXYTOCIN-SODIUM CHLORIDE 30-0.9 UT/500ML-% IV SOLN
INTRAVENOUS | Status: DC | PRN
  Administered 2022-01-01 (×2): 30 [IU] via INTRAVENOUS

## 2022-01-01 MED ORDER — PHENYLEPHRINE HCL-NACL 20-0.9 MG/250ML-% IV SOLN
INTRAVENOUS | Status: AC
  Filled 2022-01-01: qty 250

## 2022-01-01 MED ORDER — LACTATED RINGERS IV SOLN
INTRAVENOUS | Status: DC
  Administered 2022-01-01: 50 mL/h via INTRAVENOUS

## 2022-01-01 MED ORDER — ONDANSETRON HCL 4 MG/2ML IJ SOLN
INTRAMUSCULAR | Status: AC
  Filled 2022-01-01: qty 2

## 2022-01-01 MED ORDER — OXYTOCIN-SODIUM CHLORIDE 30-0.9 UT/500ML-% IV SOLN
INTRAVENOUS | Status: AC
  Filled 2022-01-01: qty 500

## 2022-01-01 MED ORDER — LACTATED RINGERS IV SOLN: INTRAVENOUS | Status: DC

## 2022-01-01 MED ORDER — SOD CITRATE-CITRIC ACID 500-334 MG/5ML PO SOLN
ORAL | Status: AC
  Filled 2022-01-01: qty 30

## 2022-01-01 MED ORDER — ACETAMINOPHEN 500 MG PO TABS
1000.0000 mg | ORAL_TABLET | ORAL | Status: AC
  Administered 2022-01-01: 1000 mg via ORAL

## 2022-01-01 MED ORDER — KETOROLAC TROMETHAMINE 30 MG/ML IJ SOLN: 30.0000 mg | Freq: Four times a day (QID) | INTRAMUSCULAR | Status: AC | PRN

## 2022-01-01 MED ORDER — ZOLPIDEM TARTRATE 5 MG PO TABS: 5.0000 mg | ORAL_TABLET | Freq: Every evening | ORAL | Status: DC | PRN

## 2022-01-01 MED ORDER — SODIUM CHLORIDE 0.9% FLUSH: 3.0000 mL | INTRAVENOUS | Status: DC | PRN

## 2022-01-01 MED ORDER — OXYTOCIN-SODIUM CHLORIDE 30-0.9 UT/500ML-% IV SOLN
2.5000 [IU]/h | INTRAVENOUS | Status: AC
  Administered 2022-01-01: 2.5 [IU]/h via INTRAVENOUS
  Filled 2022-01-01: qty 500

## 2022-01-01 MED ORDER — FAMOTIDINE 20 MG PO TABS
20.0000 mg | ORAL_TABLET | Freq: Once | ORAL | Status: AC
  Administered 2022-01-01: 20 mg via ORAL

## 2022-01-01 MED ORDER — ACETAMINOPHEN 500 MG PO TABS
1000.0000 mg | ORAL_TABLET | Freq: Four times a day (QID) | ORAL | Status: DC
  Administered 2022-01-01 – 2022-01-03 (×8): 1000 mg via ORAL
  Filled 2022-01-01 (×10): qty 2

## 2022-01-01 MED ORDER — FAMOTIDINE 20 MG PO TABS
ORAL_TABLET | ORAL | Status: AC
  Filled 2022-01-01: qty 1

## 2022-01-01 MED ORDER — PROMETHAZINE HCL 25 MG/ML IJ SOLN: 6.2500 mg | INTRAMUSCULAR | Status: DC | PRN

## 2022-01-01 MED ORDER — GENTAMICIN SULFATE 40 MG/ML IJ SOLN
5.0000 mg/kg | INTRAVENOUS | Status: AC
  Administered 2022-01-01: 380 mg via INTRAVENOUS
  Filled 2022-01-01: qty 9.5

## 2022-01-01 MED ORDER — FERROUS SULFATE 325 (65 FE) MG PO TABS
325.0000 mg | ORAL_TABLET | ORAL | Status: DC
  Administered 2022-01-01 – 2022-01-03 (×2): 325 mg via ORAL
  Filled 2022-01-01 (×2): qty 1

## 2022-01-01 MED ORDER — SODIUM CHLORIDE 0.9 % IR SOLN
Status: DC | PRN
  Administered 2022-01-01: 1

## 2022-01-01 MED ORDER — DEXAMETHASONE SODIUM PHOSPHATE 10 MG/ML IJ SOLN
INTRAMUSCULAR | Status: DC | PRN
  Administered 2022-01-01 (×2): 5 mg via INTRAVENOUS

## 2022-01-01 MED ORDER — ACETAMINOPHEN 500 MG PO TABS
ORAL_TABLET | ORAL | Status: AC
  Filled 2022-01-01: qty 2

## 2022-01-01 MED ORDER — TRANEXAMIC ACID-NACL 1000-0.7 MG/100ML-% IV SOLN
INTRAVENOUS | Status: DC | PRN
  Administered 2022-01-01: 1000 mg via INTRAVENOUS

## 2022-01-01 MED ORDER — MORPHINE SULFATE (PF) 0.5 MG/ML IJ SOLN
INTRAMUSCULAR | Status: DC | PRN
  Administered 2022-01-01: .15 mg via INTRATHECAL

## 2022-01-01 MED ORDER — BUPIVACAINE IN DEXTROSE 0.75-8.25 % IT SOLN
INTRATHECAL | Status: DC | PRN
  Administered 2022-01-01: 1.6 mL via INTRATHECAL

## 2022-01-01 MED ORDER — DIPHENHYDRAMINE HCL 25 MG PO CAPS: 25.0000 mg | ORAL_CAPSULE | Freq: Four times a day (QID) | ORAL | Status: DC | PRN

## 2022-01-01 MED ORDER — FENTANYL CITRATE (PF) 100 MCG/2ML IJ SOLN
INTRAMUSCULAR | Status: DC | PRN
  Administered 2022-01-01: 15 ug via INTRATHECAL

## 2022-01-01 MED ORDER — MORPHINE SULFATE (PF) 0.5 MG/ML IJ SOLN
INTRAMUSCULAR | Status: AC
  Filled 2022-01-01: qty 10

## 2022-01-01 MED ORDER — OXYCODONE HCL 5 MG PO TABS
5.0000 mg | ORAL_TABLET | ORAL | Status: DC | PRN
  Administered 2022-01-01 – 2022-01-03 (×6): 10 mg via ORAL
  Filled 2022-01-01 (×6): qty 2

## 2022-01-01 MED ORDER — IBUPROFEN 600 MG PO TABS
600.0000 mg | ORAL_TABLET | Freq: Four times a day (QID) | ORAL | Status: DC
  Administered 2022-01-02 – 2022-01-03 (×5): 600 mg via ORAL
  Filled 2022-01-01 (×5): qty 1

## 2022-01-01 MED ORDER — ONDANSETRON HCL 4 MG/2ML IJ SOLN: 4.0000 mg | Freq: Three times a day (TID) | INTRAMUSCULAR | Status: DC | PRN

## 2022-01-01 MED ORDER — CLINDAMYCIN PHOSPHATE 900 MG/50ML IV SOLN
900.0000 mg | INTRAVENOUS | Status: AC
  Administered 2022-01-01: 900 mg via INTRAVENOUS

## 2022-01-01 MED ORDER — KETOROLAC TROMETHAMINE 30 MG/ML IJ SOLN
30.0000 mg | Freq: Four times a day (QID) | INTRAMUSCULAR | Status: AC | PRN
  Administered 2022-01-01 – 2022-01-02 (×4): 30 mg via INTRAVENOUS
  Filled 2022-01-01 (×4): qty 1

## 2022-01-01 MED ORDER — SCOPOLAMINE 1 MG/3DAYS TD PT72
MEDICATED_PATCH | TRANSDERMAL | Status: AC
  Filled 2022-01-01: qty 1

## 2022-01-01 MED ORDER — DIPHENHYDRAMINE HCL 25 MG PO CAPS
25.0000 mg | ORAL_CAPSULE | ORAL | Status: DC | PRN
  Administered 2022-01-01 (×2): 25 mg via ORAL
  Filled 2022-01-01 (×2): qty 1

## 2022-01-01 MED ORDER — ACETAMINOPHEN 500 MG PO TABS: 1000.0000 mg | ORAL_TABLET | Freq: Four times a day (QID) | ORAL | Status: DC

## 2022-01-01 MED ORDER — CHLORHEXIDINE GLUCONATE 0.12 % MT SOLN: 15.0000 mL | Freq: Once | OROMUCOSAL | Status: DC

## 2022-01-01 MED ORDER — STERILE WATER FOR IRRIGATION IR SOLN
Status: DC | PRN
  Administered 2022-01-01: 1000 mL

## 2022-01-01 MED ORDER — SIMETHICONE 80 MG PO CHEW: 80.0000 mg | CHEWABLE_TABLET | ORAL | Status: DC | PRN

## 2022-01-01 MED ORDER — SCOPOLAMINE 1 MG/3DAYS TD PT72: 1.0000 | MEDICATED_PATCH | Freq: Once | TRANSDERMAL | Status: DC

## 2022-01-01 MED ORDER — ONDANSETRON HCL 4 MG/2ML IJ SOLN
INTRAMUSCULAR | Status: DC | PRN
  Administered 2022-01-01: 4 mg via INTRAVENOUS

## 2022-01-01 MED ORDER — MENTHOL 3 MG MT LOZG: 1.0000 | LOZENGE | OROMUCOSAL | Status: DC | PRN

## 2022-01-01 MED ORDER — PHENYLEPHRINE 80 MCG/ML (10ML) SYRINGE FOR IV PUSH (FOR BLOOD PRESSURE SUPPORT)
PREFILLED_SYRINGE | INTRAVENOUS | Status: DC | PRN
  Administered 2022-01-01: 80 ug via INTRAVENOUS

## 2022-01-01 MED ORDER — ORAL CARE MOUTH RINSE: 15.0000 mL | Freq: Once | OROMUCOSAL | Status: DC

## 2022-01-01 MED ORDER — DIPHENHYDRAMINE HCL 50 MG/ML IJ SOLN
12.5000 mg | INTRAMUSCULAR | Status: DC | PRN
  Administered 2022-01-01: 12.5 mg via INTRAVENOUS
  Filled 2022-01-01: qty 1

## 2022-01-01 MED ORDER — SENNOSIDES-DOCUSATE SODIUM 8.6-50 MG PO TABS
2.0000 | ORAL_TABLET | Freq: Every day | ORAL | Status: DC
  Administered 2022-01-02 – 2022-01-03 (×2): 2 via ORAL
  Filled 2022-01-01 (×3): qty 2

## 2022-01-01 MED ORDER — PHENYLEPHRINE HCL-NACL 20-0.9 MG/250ML-% IV SOLN
INTRAVENOUS | Status: DC | PRN
  Administered 2022-01-01: 60 ug/min via INTRAVENOUS

## 2022-01-01 MED ORDER — NALOXONE HCL 4 MG/10ML IJ SOLN: 1.0000 ug/kg/h | INTRAVENOUS | Status: DC | PRN

## 2022-01-01 MED ORDER — SIMETHICONE 80 MG PO CHEW
80.0000 mg | CHEWABLE_TABLET | Freq: Three times a day (TID) | ORAL | Status: DC
  Administered 2022-01-01 – 2022-01-03 (×6): 80 mg via ORAL
  Filled 2022-01-01 (×7): qty 1

## 2022-01-01 MED ORDER — SCOPOLAMINE 1 MG/3DAYS TD PT72
1.0000 | MEDICATED_PATCH | Freq: Once | TRANSDERMAL | Status: DC
  Administered 2022-01-01: 1.5 mg via TRANSDERMAL

## 2022-01-01 MED ORDER — SOD CITRATE-CITRIC ACID 500-334 MG/5ML PO SOLN: 30.0000 mL | Freq: Once | ORAL | Status: DC

## 2022-01-01 MED ORDER — DEXAMETHASONE SODIUM PHOSPHATE 10 MG/ML IJ SOLN
INTRAMUSCULAR | Status: AC
  Filled 2022-01-01: qty 1

## 2022-01-01 MED ORDER — PRENATAL MULTIVITAMIN CH
1.0000 | ORAL_TABLET | Freq: Every day | ORAL | Status: DC
  Administered 2022-01-02: 1 via ORAL
  Filled 2022-01-01 (×2): qty 1

## 2022-01-01 MED ORDER — COCONUT OIL OIL: 1.0000 | TOPICAL_OIL | Status: DC | PRN

## 2022-01-01 MED ORDER — NALOXONE HCL 0.4 MG/ML IJ SOLN: 0.4000 mg | INTRAMUSCULAR | Status: DC | PRN

## 2022-01-01 MED ORDER — MEPERIDINE HCL 25 MG/ML IJ SOLN: 6.2500 mg | INTRAMUSCULAR | Status: DC | PRN

## 2022-01-01 MED ORDER — FENTANYL CITRATE (PF) 100 MCG/2ML IJ SOLN
25.0000 ug | INTRAMUSCULAR | Status: DC | PRN
  Administered 2022-01-01: 25 ug via INTRAVENOUS

## 2022-01-01 MED ORDER — SOD CITRATE-CITRIC ACID 500-334 MG/5ML PO SOLN
30.0000 mL | ORAL | Status: AC
  Administered 2022-01-01: 30 mL via ORAL

## 2022-01-01 SURGICAL SUPPLY — 41 items
BENZOIN TINCTURE PRP APPL 2/3 (GAUZE/BANDAGES/DRESSINGS) IMPLANT
CHLORAPREP W/TINT 26 (MISCELLANEOUS) ×2 IMPLANT
CLAMP UMBILICAL CORD (MISCELLANEOUS) ×1 IMPLANT
CLOTH BEACON ORANGE TIMEOUT ST (SAFETY) ×1 IMPLANT
DRAPE C SECTION CLR SCREEN (DRAPES) ×1 IMPLANT
DRSG OPSITE POSTOP 4X10 (GAUZE/BANDAGES/DRESSINGS) ×1 IMPLANT
ELECT REM PT RETURN 9FT ADLT (ELECTROSURGICAL) ×1
ELECTRODE REM PT RTRN 9FT ADLT (ELECTROSURGICAL) ×1 IMPLANT
EXTRACTOR VACUUM KIWI (MISCELLANEOUS) IMPLANT
GAUZE SPONGE 4X4 12PLY STRL LF (GAUZE/BANDAGES/DRESSINGS) IMPLANT
GLOVE BIO SURGEON STRL SZ 6.5 (GLOVE) ×1 IMPLANT
GLOVE BIOGEL PI IND STRL 7.0 (GLOVE) ×2 IMPLANT
GOWN STRL REUS W/TWL LRG LVL3 (GOWN DISPOSABLE) ×2 IMPLANT
HEMOSTAT ARISTA ABSORB 3G PWDR (HEMOSTASIS) IMPLANT
KIT ABG SYR 3ML LUER SLIP (SYRINGE) IMPLANT
NDL HYPO 25X5/8 SAFETYGLIDE (NEEDLE) IMPLANT
NEEDLE HYPO 25X5/8 SAFETYGLIDE (NEEDLE) IMPLANT
NS IRRIG 1000ML POUR BTL (IV SOLUTION) ×1 IMPLANT
PACK C SECTION WH (CUSTOM PROCEDURE TRAY) ×1 IMPLANT
PAD ABD 7.5X8 STRL (GAUZE/BANDAGES/DRESSINGS) IMPLANT
PAD OB MATERNITY 4.3X12.25 (PERSONAL CARE ITEMS) ×1 IMPLANT
PENCIL SMOKE EVAC W/HOLSTER (ELECTROSURGICAL) IMPLANT
RETRACTOR WND ALEXIS 25 LRG (MISCELLANEOUS) ×1 IMPLANT
RTRCTR C-SECT PINK 25CM LRG (MISCELLANEOUS) IMPLANT
RTRCTR WOUND ALEXIS 25CM LRG (MISCELLANEOUS) ×1
SPONGE T-LAP 18X18 ~~LOC~~+RFID (SPONGE) IMPLANT
STRIP CLOSURE SKIN 1/2X4 (GAUZE/BANDAGES/DRESSINGS) IMPLANT
SUT CHROMIC 1 CTX 36 (SUTURE) ×2 IMPLANT
SUT PLAIN 0 NONE (SUTURE) IMPLANT
SUT PLAIN 2 0 XLH (SUTURE) ×1 IMPLANT
SUT VIC AB 0 CT1 27 (SUTURE) ×2
SUT VIC AB 0 CT1 27XBRD ANBCTR (SUTURE) ×2 IMPLANT
SUT VIC AB 0 CT1 36 (SUTURE) IMPLANT
SUT VIC AB 2-0 CT1 27 (SUTURE) ×2
SUT VIC AB 2-0 CT1 TAPERPNT 27 (SUTURE) ×1 IMPLANT
SUT VIC AB 3-0 CT1 27 (SUTURE)
SUT VIC AB 3-0 CT1 TAPERPNT 27 (SUTURE) IMPLANT
SUT VIC AB 4-0 KS 27 (SUTURE) ×1 IMPLANT
TOWEL OR 17X24 6PK STRL BLUE (TOWEL DISPOSABLE) ×1 IMPLANT
TRAY FOLEY W/BAG SLVR 14FR LF (SET/KITS/TRAYS/PACK) ×1 IMPLANT
WATER STERILE IRR 1000ML POUR (IV SOLUTION) ×1 IMPLANT

## 2022-01-01 NOTE — Anesthesia Postprocedure Evaluation (Signed)
Anesthesia Post Note  [name]  Procedure(s) Performed: CESAREAN SECTION     Patient location during evaluation: PACU Anesthesia Type: Spinal Level of consciousness: awake, awake and alert and oriented Pain management: pain level controlled Vital Signs Assessment: post-procedure vital signs reviewed and stable Respiratory status: spontaneous breathing, nonlabored ventilation and respiratory function stable Cardiovascular status: blood pressure returned to baseline and stable Postop Assessment: no headache, no backache, spinal receding and no apparent nausea or vomiting Anesthetic complications: no   No notable events documented.  Last Vitals:  Vitals:   01/01/22 1034 01/01/22 1223  BP: 121/72 131/68  Pulse: (!) 57 66  Resp:  18  Temp: (!) 36.4 C 36.7 C  SpO2: 100% 99%    Last Pain:  Vitals:   01/01/22 1357  TempSrc:   PainSc: 3    Pain Goal:                   Collene Schlichter

## 2022-01-01 NOTE — Social Work (Signed)
MOB was referred for history of anxiety.  * Referral screened out by Clinical Social Worker because none of the following criteria appear to apply:  ~ History of anxiety/depression during this pregnancy, or of post-partum depression following prior delivery. Per [facility] no concerns noted.  ~ Diagnosis of anxiety and/or depression within last 3 years Diagnosis prior to December 2020. OR * MOB's symptoms currently being treated with medication and/or therapy.  Please contact the Clinical Social Worker if needs arise or  by MOB request.  Wende Neighbors, LCSWA Clinical Social Worker [phone]

## 2022-01-01 NOTE — Anesthesia Procedure Notes (Signed)
Spinal  Patient location during procedure: OR Start time: 01/01/2022 7:34 AM End time: 01/01/2022 7:37 AM Reason for block: surgical anesthesia Staffing Performed: anesthesiologist  Anesthesiologist: Collene Schlichter, MD Performed by: Collene Schlichter, MD Authorized by: Collene Schlichter, MD   Preanesthetic Checklist Completed: patient identified, IV checked, risks and benefits discussed, surgical consent, monitors and equipment checked, pre-op evaluation and timeout performed Spinal Block Patient position: sitting Prep: DuraPrep and site prepped and draped Patient monitoring: continuous pulse ox and blood pressure Approach: midline Location: L3-4 Injection technique: single-shot Needle Needle type: Pencan  Needle gauge: 24 G Assessment Sensory level: T6 Events: CSF return Additional Notes Functioning IV was confirmed and monitors were applied. Sterile prep and drape, including hand hygiene, mask and sterile gloves were used. The patient was positioned and the spine was prepped. The skin was anesthetized with lidocaine.  Free flow of clear CSF was obtained prior to injecting local anesthetic into the CSF.  The spinal needle aspirated freely following injection.  The needle was carefully withdrawn.  The patient tolerated the procedure well. Consent was obtained prior to procedure with all questions answered and concerns addressed. Risks including but not limited to bleeding, infection, nerve damage, paralysis, failed block, inadequate analgesia, allergic reaction, high spinal, itching and headache were discussed and the patient wished to proceed.   Arrie Aran, MD

## 2022-01-01 NOTE — Interval H&P Note (Signed)
History and Physical Interval Note:  Pt with no change since H/P done Consent confirmed  To OR when ready   01/01/2022 7:30 AM  Briana Pham  has presented today for surgery, with the diagnosis of repeat cesarean section.  The various methods of treatment have been discussed with the patient and family. After consideration of risks, benefits and other options for treatment, the patient has consented to  Procedure(s) with comments: CESAREAN SECTION (N/A) - request RNFA or Fellow as a surgical intervention.  The patient's history has been reviewed, patient examined, no change in status, stable for surgery.  I have reviewed the patient's chart and labs.  Questions were answered to the patient's satisfaction.     Cathrine Muster

## 2022-01-01 NOTE — Anesthesia Preprocedure Evaluation (Signed)
Anesthesia Evaluation  Patient identified by MRN, date of birth, ID band Patient awake    Reviewed: Allergy & Precautions, NPO status , Patient's Chart, lab work & pertinent test results  Airway Mallampati: II  TM Distance: >3 FB Neck ROM: Full    Dental  (+) Teeth Intact, Dental Advisory Given   Pulmonary asthma , former smoker   Pulmonary exam normal breath sounds clear to auscultation       Cardiovascular hypertension (h/o PIH), Normal cardiovascular exam Rhythm:Regular Rate:Normal     Neuro/Psych  PSYCHIATRIC DISORDERS Anxiety     negative neurological ROS     GI/Hepatic negative GI ROS, Neg liver ROS,,,  Endo/Other  Obesity   Renal/GU negative Renal ROS     Musculoskeletal negative musculoskeletal ROS (+)    Abdominal   Peds  Hematology  (+) Blood dyscrasia, anemia Plt 327k   Anesthesia Other Findings Day of surgery medications reviewed with the patient.  Reproductive/Obstetrics (+) Pregnancy                              Anesthesia Physical Anesthesia Plan  ASA: 2  Anesthesia Plan: Spinal   Post-op Pain Management:    Induction: Intravenous  PONV Risk Score and Plan: 2 and Scopolamine patch - Pre-op, Dexamethasone and Ondansetron  Airway Management Planned: Natural Airway  Additional Equipment:   Intra-op Plan:   Post-operative Plan:   Informed Consent: I have reviewed the patients History and Physical, chart, labs and discussed the procedure including the risks, benefits and alternatives for the proposed anesthesia with the patient or authorized representative who has indicated his/her understanding and acceptance.     Dental advisory given  Plan Discussed with: CRNA, Anesthesiologist and Surgeon  Anesthesia Plan Comments: (Discussed risks and benefits of and differences between spinal and general. Discussed risks of spinal including headache, backache, failure,  bleeding, infection, and nerve damage. Patient consents to spinal. Questions answered. Coagulation studies and platelet count acceptable.)         Anesthesia Quick Evaluation

## 2022-01-01 NOTE — Op Note (Signed)
Operative Note    Preoperative Diagnosis IUP at 69 0/7wks  Previous cesarean section  FET Pregnancy AMA    Postoperative Diagnosis: Same  5. Postpartum hemorrhage    Procedure: Repeat low transverse cesarean section with double layered closure and no extensions using vacuum assistance    Surgeon: Mickle Mallory DO  Assist: Clement Husbands, J DO   An experienced assistant was required given the standard of surgical care given the complexity of the case and maternal body habitus.  This assistant was needed for exposure, dissection, suctioning, retraction, instrument exchange, assisting with delivery with administration of fundal pressure, and for overall help during the procedure.    Anesthesia: Spinal   Fluids: LR 1415ml EBL: 1010ml UOP: 6ml ( voided prior to OR )    Findings: Viable female infant in vertex asynclitic position . APGARS 8,9, Weight 6lbs 3oz. Grossly normal uterus, tubes and ovaries    Specimen: Placenta to patient    Procedure Note  Consent verified pre-op. All questions answered   Patient was taken to the operating room where spinal anesthesia was administered. She was prepped and draped in the normal sterile fashion and placed in the dorsal supine position with a leftward tilt. An appropriate time out was performed.   A Pfannenstiel skin incision was then made through the previous incision with the scalpel and carried through to the underlying layer of fascia by sharp dissection and Bovie cautery. The fascia was nicked in the midline and the incision was extended laterally with Mayo scissors. The superior and inferior aspects of the incision were grasped with kocher clamps and dissected off the underlying rectus muscles. The rectus muscles were separated in the midline  and the peritoneal cavity entered bluntly. The peritoneal incision was then extended both superiorly and inferiorly with careful attention to avoid both bowel and bladder. The Alexis  self-retaining wound retractor was then placed and the lower uterine segment exposed.  The bladder flap was developed with Metzenbaum scissors and pushed away from the lower uterine segment. The lower uterine segment was then incised in a transverse fashion and the cavity itself entered bluntly. The incision was extended bluntly.  The infant's head was in an asynclitic position and required one pull using the kiwi vacuum to deliver. Vigorous spontaneous cry was noted during delivery.  The remainder of the infant delivered easily and the nose and mouth were bulb suctioned. The cord was clamped and cut after a minute and a half delay per patient request.   The infant was handed off to the waiting NICU team. The placenta was then manually expressed from the uterus and the uterus cleared of all clots and debris with moist lap sponge. The uterine incision was then repaired in 2 layers the first layer was a running locked layer 1-0 chromic and the second an imbricating layer of the same suture. An area in the right corner of the incision had continuous brisk bleeding hence extra figure 8 stitches were placed using 0-vicryl suture with great hemostasis noted. Arista was liberally applied to the area as well. The tubes and ovaries were inspected, found to be normal, and the gutters cleared of all clots and debris. The uterine incision was inspected and found to be hemostatic. All instruments and sponges were then removed from the abdomen.   The peritoneum and rectus muscles were then reapproximated in a running fashion with sutures of 2-0 Vicryl.  The fascia was then closed with 0 Vicryl in a running fashion. Subcutaneous tissue was  reapproximated with 3-0 plain in a running fashion. The skin was closed with a subcuticular stitch of 4-0 Vicryl on a Keith needle and then reinforced with benzoin, Steri-Strips, honeycomb and a pressure dressing.   At the conclusion of the procedure all instruments and sponge counts  were correct. Patient was taken to the recovery room in good condition with her baby accompanying her skin to skin.

## 2022-01-01 NOTE — Transfer of Care (Signed)
Immediate Anesthesia Transfer of Care Note  [name]  Procedure(s) Performed: CESAREAN SECTION  Patient Location: PACU  Anesthesia Type:Spinal  Level of Consciousness: awake, alert , and oriented  Airway & Oxygen Therapy: Patient Spontanous Breathing  Post-op Assessment: Report given to RN and Post -op Vital signs reviewed and stable  Post vital signs: Reviewed and stable  Last Vitals:  Vitals Value Taken Time  BP 123/82 01/01/22 1015  Temp 36.4 C 01/01/22 0930  Pulse 63 01/01/22 1017  Resp 26 01/01/22 1016  SpO2 94 % 01/01/22 1017  Vitals shown include unvalidated device data.  Last Pain:  Vitals:   01/01/22 1007  TempSrc:   PainSc: 5          Complications: No notable events documented.

## 2022-01-02 LAB — CBC
HCT: 23.5 % — ABNORMAL LOW (ref 36.0–46.0)
Hemoglobin: 7.4 g/dL — ABNORMAL LOW (ref 12.0–15.0)
MCH: 26.2 pg (ref 26.0–34.0)
MCHC: 31.5 g/dL (ref 30.0–36.0)
MCV: 83.3 fL (ref 80.0–100.0)
Platelets: 269 10*3/uL (ref 150–400)
RBC: 2.82 MIL/uL — ABNORMAL LOW (ref 3.87–5.11)
RDW: 16.2 % — ABNORMAL HIGH (ref 11.5–15.5)
WBC: 14.1 10*3/uL — ABNORMAL HIGH (ref 4.0–10.5)
nRBC: 0 % (ref 0.0–0.2)

## 2022-01-02 MED ORDER — SODIUM CHLORIDE 0.9 % IV SOLN
500.0000 mg | Freq: Once | INTRAVENOUS | Status: AC
  Administered 2022-01-02: 500 mg via INTRAVENOUS
  Filled 2022-01-02: qty 500

## 2022-01-02 MED ORDER — WITCH HAZEL-GLYCERIN EX PADS: 1.0000 | MEDICATED_PAD | CUTANEOUS | Status: DC | PRN

## 2022-01-02 MED ORDER — DIBUCAINE (PERIANAL) 1 % EX OINT: 1.0000 | TOPICAL_OINTMENT | CUTANEOUS | Status: DC | PRN

## 2022-01-02 MED ORDER — SODIUM CHLORIDE 0.9 % IV SOLN: 250.0000 mg | Freq: Every day | INTRAVENOUS | Status: DC

## 2022-01-02 NOTE — Lactation Note (Signed)
This note was copied from a baby's chart. Lactation Consultation Note  Patient Name: Briana Pham Date: 01/02/2022   Age:38 hours Declined Lactation. Maternal Data    Feeding    LATCH Score                    Lactation Tools Discussed/Used    Interventions    Discharge    Consult Status Consult Status: Complete    Briana Pham G 01/02/2022, 12:11 AM

## 2022-01-02 NOTE — Progress Notes (Signed)
POSTPARTUM POSTOP PROGRESS NOTE  POD #1  Subjective:  No acute events overnight.  Pt denies problems with ambulating, voiding or po intake.  She denies nausea or vomiting.  Pain is well controlled.  She has had flatus. She has not had bowel movement.  Lochia Minimal. Reviewed Hgb drop 2/2 PPH, amenable to iron transfusion, feels some fatigue  Objective: Blood pressure 117/69, pulse 65, temperature 97.7 F (36.5 C), temperature source Oral, resp. rate 16, height 5\' 7"  (1.702 m), weight 100.4 kg, SpO2 100 %, unknown if currently breastfeeding.  Physical Exam:  General: alert, cooperative and no distress Lochia:normal flow Chest: CTAB Heart: RRR no m/r/g Abdomen: +BS, soft, nontender Uterine Fundus: firm, 2cm below umbilicus. Honeycomb dressing intact, neg drainage Extremities: neg edema, neg calf TTP BL, neg Homans BL  Recent Labs    01/02/22 0535  HGB 7.4*  HCT 23.5*    Assessment/Plan:  ASSESSMENT: Briana Pham is a 36 y.o. 20 s/p ERLTCS @ [redacted]w[redacted]d for h/o csx x1. PNC c/b PPH of 1066cc.   Breastfeeding and Lactation consult Anemia - 2/2 acute EBL - IV iron today, repeat CBC POD#2   LOS: 1 day

## 2022-01-03 MED ORDER — IBUPROFEN 800 MG PO TABS: 800.0000 mg | ORAL_TABLET | Freq: Three times a day (TID) | ORAL | 1 refills | Status: DC | PRN

## 2022-01-03 MED ORDER — FERROUS SULFATE 325 (65 FE) MG PO TABS: 325.0000 mg | ORAL_TABLET | Freq: Every day | ORAL | 0 refills | Status: DC

## 2022-01-03 MED ORDER — OXYCODONE HCL 5 MG PO TABS: 5.0000 mg | ORAL_TABLET | Freq: Four times a day (QID) | ORAL | 0 refills | Status: DC | PRN

## 2022-01-03 NOTE — Progress Notes (Signed)
POSTPARTUM POSTOP PROGRESS NOTE  POD #2  Subjective:  No acute events overnight.  Pt denies problems with ambulating, voiding or po intake.  She denies nausea or vomiting.  Pain is well controlled.  She has had flatus. She has not had bowel movement.  Lochia Minimal. S/p venofer x1, asymptomatic, baby cleared for discharge  Objective: Blood pressure 128/72, pulse 65, temperature 97.8 F (36.6 C), temperature source Oral, resp. rate 16, height 5\' 7"  (1.702 m), weight 100.4 kg, SpO2 100 %, unknown if currently breastfeeding.  Physical Exam:  General: alert, cooperative and no distress Lochia:normal flow Chest: CTAB Heart: RRR no m/r/g Abdomen: +BS, soft, nontender Uterine Fundus: firm, 2cm below umbilicus. Honeycomb dressing intact, neg drainage Extremities: neg edema, neg calf TTP BL, neg Homans BL  Recent Labs    01/02/22 0535  HGB 7.4*  HCT 23.5*     Assessment/Plan:  ASSESSMENT: Briana Pham is a 36 y.o. 20 s/p ERLTCS @ [redacted]w[redacted]d for h/o csx x1. PNC c/b PPH of 1066cc.   Breastfeeding and Lactation consult Anemia - 2/2 acute EBL - s/p Venofer, will DC with oral iron as well. F>u for 2wk incision check and 6wk pp    LOS: 2 days

## 2022-01-03 NOTE — Discharge Summary (Signed)
Postpartum Discharge Summary  Date of Service updated     Patient Name: Briana Pham DOB: 06/23/1985 MRN: [id number]  Date of admission: 01/01/2022 Delivery date:01/01/2022  Delivering provider: Edwinna Areola  Date of discharge: 01/03/2022  Admitting diagnosis: Previous cesarean delivery affecting pregnancy [O34.219] Intrauterine pregnancy: [redacted]w[redacted]d     Secondary diagnosis:  Principal Problem:   Previous cesarean delivery affecting pregnancy  Additional problems: Anemia 2/2 acute EBL, IUGR with intermittent elevated Dopplers    Discharge diagnosis: Term Pregnancy Delivered                                              Post partum procedures: IV VENOFER Augmentation: N/A Complications: None  Hospital course: Sceduled C/S   36 y.o. yo [id number] at [redacted]w[redacted]d was admitted to the hospital 01/01/2022 for scheduled cesarean section with the following indication:Elective Repeat.Delivery details are as follows:  Membrane Rupture Time/Date: 8:04 AM ,01/01/2022   Delivery Method:C-Section, Vacuum Assisted  Details of operation can be found in separate operative note.  Patient had a postpartum course complicated by anemia, s/p IV venofer.  She is ambulating, tolerating a regular diet, passing flatus, and urinating well. Patient is discharged home in stable condition on  01/03/22        Newborn Data: Birth date:01/01/2022  Birth time:8:05 AM  Gender:Female  Living status:Living  Apgars:8 ,9  Weight:2820 g      Transfusion:Yes Venofer  Physical exam  Vitals:   01/02/22 1609 01/02/22 1821 01/02/22 2157 01/03/22 0530  BP: (!) 145/82 122/82 130/80 128/72  Pulse: 70 68 65 65  Resp: 19  18 16   Temp: 97.7 F (36.5 C)  98.1 F (36.7 C) 97.8 F (36.6 C)  TempSrc: Oral  Oral Oral  SpO2: 100%  100% 100%  Weight:      Height:       General: alert, cooperative, and no distress Lochia: appropriate Uterine Fundus: firm Incision: Healing well with no significant drainage, No  significant erythema, Dressing is clean, dry, and intact DVT Evaluation: No evidence of DVT seen on physical exam. Negative Homan's sign. No cords or calf tenderness. Labs: Lab Results  Component Value Date   WBC 14.1 (H) 01/02/2022   HGB 7.4 (L) 01/02/2022   HCT 23.5 (L) 01/02/2022   MCV 83.3 01/02/2022   PLT 269 01/02/2022      Latest Ref Rng & Units 07/07/2021    1:55 PM  CMP  Glucose 70 - 99 mg/dL 90   BUN 6 - 20 mg/dL 8   Creatinine 09/07/2021 - 5.95 mg/dL 6.38   Sodium 7.56 - 433 mmol/L 136   Potassium 3.5 - 5.1 mmol/L 3.9   Chloride 98 - 111 mmol/L 105   CO2 22 - 32 mmol/L 21   Calcium 8.9 - 10.3 mg/dL 9.1   Total Protein 6.5 - 8.1 g/dL 7.3   Total Bilirubin 0.3 - 1.2 mg/dL 0.3   Alkaline Phos 38 - 126 U/L 93   AST 15 - 41 U/L 14   ALT 0 - 44 U/L 11    Edinburgh Score:    01/01/2022    2:13 PM  Edinburgh Postnatal Depression Scale Screening Tool  I have been able to laugh and see the funny side of things. 0  I have looked forward with enjoyment to things. 0  I have blamed myself unnecessarily  when things went wrong. 1  I have been anxious or worried for no good reason. 1  I have felt scared or panicky for no good reason. 0  Things have been getting on top of me. 0  I have been so unhappy that I have had difficulty sleeping. 0  I have felt sad or miserable. 2  I have been so unhappy that I have been crying. 0  The thought of harming myself has occurred to me. 0  Edinburgh Postnatal Depression Scale Total 4      After visit meds:  Allergies as of 01/03/2022       Reactions   Azithromycin Anaphylaxis, Itching   Rash, crawly skin, could not form sentences Other reaction(s): Confusion   Penicillins Hives, Itching   Confusion Other reaction(s): Confusion ALL CILLIN FAMILY*        Medication List     STOP taking these medications    aspirin EC 81 MG tablet       TAKE these medications    ferrous sulfate 325 (65 FE) MG tablet Take 1 tablet (325 mg  total) by mouth daily with breakfast.   ibuprofen 800 MG tablet Commonly known as: ADVIL Take 1 tablet (800 mg total) by mouth every 8 (eight) hours as needed.   oxyCODONE 5 MG immediate release tablet Commonly known as: Oxy IR/ROXICODONE Take 1 tablet (5 mg total) by mouth every 6 (six) hours as needed for severe pain.   PRENATAL COMPLETE PO Take 1 tablet by mouth daily.         Discharge home in stable condition Infant Feeding: Breast Infant Disposition:home with mother Discharge instruction: per After Visit Summary and Postpartum booklet. Activity: Advance as tolerated. Pelvic rest for 6 weeks.  Diet: routine diet Anticipated Birth Control: Unsure and   Postpartum Appointment:6 weeks Additional Postpartum F/U: Incision check 2wks Follow up Visit: [facility]   01/03/2022 Deliah Boston, MD

## 2022-01-09 ENCOUNTER — Telehealth (HOSPITAL_COMMUNITY): Payer: Self-pay

## 2022-01-09 NOTE — Telephone Encounter (Signed)
Patient did not answer phone call. Voicemail left for patient.   [facility]

## 2023-06-07 ENCOUNTER — Other Ambulatory Visit: Payer: Self-pay | Admitting: Physical Medicine and Rehabilitation

## 2023-06-07 ENCOUNTER — Ambulatory Visit
Admission: RE | Admit: 2023-06-07 | Discharge: 2023-06-07 | Disposition: A | Discharge status: Home / Self Care | Source: Ambulatory Visit | Attending: Physical Medicine and Rehabilitation | Admitting: Physical Medicine and Rehabilitation

## 2023-06-07 DIAGNOSIS — R051 Acute cough: Secondary | ICD-10-CM

## 2023-06-07 DIAGNOSIS — R590 Localized enlarged lymph nodes: Secondary | ICD-10-CM

## 2023-06-07 DIAGNOSIS — B2790 Infectious mononucleosis, unspecified without complication: Secondary | ICD-10-CM

## 2023-06-07 DIAGNOSIS — J4520 Mild intermittent asthma, uncomplicated: Secondary | ICD-10-CM

## 2023-07-27 ENCOUNTER — Other Ambulatory Visit: Payer: Self-pay | Admitting: Physical Medicine and Rehabilitation

## 2023-07-27 ENCOUNTER — Ambulatory Visit
Admission: RE | Admit: 2023-07-27 | Discharge: 2023-07-27 | Disposition: A | Discharge status: Home / Self Care | Source: Ambulatory Visit | Attending: Physical Medicine and Rehabilitation | Admitting: Physical Medicine and Rehabilitation

## 2023-07-27 DIAGNOSIS — R051 Acute cough: Secondary | ICD-10-CM

## 2023-07-27 DIAGNOSIS — J4520 Mild intermittent asthma, uncomplicated: Secondary | ICD-10-CM

## 2023-08-01 ENCOUNTER — Other Ambulatory Visit: Payer: Self-pay | Admitting: Physical Medicine and Rehabilitation

## 2023-08-01 DIAGNOSIS — R590 Localized enlarged lymph nodes: Secondary | ICD-10-CM

## 2023-08-01 DIAGNOSIS — J851 Abscess of lung with pneumonia: Secondary | ICD-10-CM

## 2023-08-02 ENCOUNTER — Inpatient Hospital Stay
Admission: RE | Admit: 2023-08-02 | Discharge: 2023-08-02 | Discharge status: Home / Self Care | Source: Ambulatory Visit | Attending: Physical Medicine and Rehabilitation | Admitting: Physical Medicine and Rehabilitation

## 2023-08-02 DIAGNOSIS — R590 Localized enlarged lymph nodes: Secondary | ICD-10-CM

## 2023-08-02 DIAGNOSIS — J851 Abscess of lung with pneumonia: Secondary | ICD-10-CM

## 2023-08-02 MED ORDER — IOPAMIDOL (ISOVUE-300) INJECTION 61%
80.0000 mL | Freq: Once | INTRAVENOUS | Status: AC | PRN
  Administered 2023-08-02: 80 mL via INTRAVENOUS

## 2023-08-03 ENCOUNTER — Other Ambulatory Visit (HOSPITAL_COMMUNITY): Payer: Self-pay | Admitting: Physical Medicine and Rehabilitation

## 2023-08-03 DIAGNOSIS — R590 Localized enlarged lymph nodes: Secondary | ICD-10-CM

## 2023-08-03 DIAGNOSIS — J984 Other disorders of lung: Secondary | ICD-10-CM

## 2023-08-04 ENCOUNTER — Ambulatory Visit (HOSPITAL_COMMUNITY)
Admission: RE | Admit: 2023-08-04 | Discharge: 2023-08-04 | Disposition: A | Discharge status: Home / Self Care | Source: Ambulatory Visit | Attending: Physical Medicine and Rehabilitation | Admitting: Physical Medicine and Rehabilitation

## 2023-08-04 DIAGNOSIS — J984 Other disorders of lung: Secondary | ICD-10-CM | POA: Insufficient documentation

## 2023-08-04 DIAGNOSIS — R590 Localized enlarged lymph nodes: Secondary | ICD-10-CM | POA: Insufficient documentation

## 2023-08-04 MED ORDER — HEPARIN SOD (PORK) LOCK FLUSH 100 UNIT/ML IV SOLN
500.0000 [IU] | Freq: Once | INTRAVENOUS | Status: AC
  Administered 2023-08-04: 500 [IU] via INTRAVENOUS

## 2023-08-04 MED ORDER — LIDOCAINE HCL 1 % IJ SOLN
INTRAMUSCULAR | Status: AC
  Filled 2023-08-04: qty 20

## 2023-08-04 MED ORDER — LIDOCAINE HCL 1 % IJ SOLN
20.0000 mL | Freq: Once | INTRAMUSCULAR | Status: AC
  Administered 2023-08-04: 2 mL via INTRADERMAL

## 2023-08-04 MED ORDER — HEPARIN SOD (PORK) LOCK FLUSH 100 UNIT/ML IV SOLN
INTRAVENOUS | Status: AC
Start: 2023-08-04 — End: 2023-08-04
  Filled 2023-08-04: qty 5

## 2023-08-04 NOTE — Procedures (Signed)
 Right brachial vein dual lumen PICC placed without immediate complications. Length 38 cm tip SVC / right atrial junction. Okay to use. Medication used - 3 mL 1% lidocaine  for numbing subcutaneous tissues. EBL < 2 ml.   Please see imaging section of Epic for full dictation.  Kimble DEL Namira Rosekrans PA-C 08/04/2023 9:58 AM

## 2023-08-09 ENCOUNTER — Other Ambulatory Visit (HOSPITAL_COMMUNITY): Payer: Self-pay | Admitting: Physical Medicine and Rehabilitation

## 2023-08-09 ENCOUNTER — Ambulatory Visit (HOSPITAL_COMMUNITY)
Admission: RE | Admit: 2023-08-09 | Discharge: 2023-08-09 | Disposition: A | Discharge status: Home / Self Care | Source: Ambulatory Visit | Attending: Interventional Radiology | Admitting: Interventional Radiology

## 2023-08-09 ENCOUNTER — Encounter (HOSPITAL_COMMUNITY): Payer: Self-pay | Admitting: Physical Medicine and Rehabilitation

## 2023-08-09 DIAGNOSIS — J984 Other disorders of lung: Secondary | ICD-10-CM

## 2023-08-09 DIAGNOSIS — R590 Localized enlarged lymph nodes: Secondary | ICD-10-CM

## 2023-08-09 HISTORY — PX: IR PATIENT EVAL TECH 0-60 MINS: IMG5564

## 2023-08-09 NOTE — Progress Notes (Signed)
 Patient presented to the IR suite with concerns regarding changing sterile dressing for her PICC line that was placed 08/04/2023.  Patient expressed when she had her treatment today, the facility could not change the dressing (per facility protocol).  IV Tegaderm was removed, stat lock device was also removed, site was cleaned with a Chlorhexidine  solution.  After dry new stat lock and bio disc were reapplied and dressed in a sterile fashion.  Patient stated home health may be able to take over in next weeks treatment. Patient instructed to return in 1 week if needed.

## 2023-08-10 ENCOUNTER — Encounter (HOSPITAL_COMMUNITY): Payer: Self-pay | Admitting: Radiology

## 2023-08-10 NOTE — Procedures (Signed)
 Waddell Rolin SAILOR, RT  Technologist Radiology   Progress Notes    Signed   Date of Service: 08/09/2023  3:30 PM   Signed      Patient presented to the IR suite with concerns regarding changing sterile dressing for her PICC line that was placed 08/04/2023.  Patient expressed when she had her treatment today, the facility could not change the dressing (per facility protocol).  IV Tegaderm was removed, stat lock device was also removed, site was cleaned with a Chlorhexidine  solution.  After dry new stat lock and bio disc were reapplied and dressed in a sterile fashion.  Patient stated home health may be able to take over in next weeks treatment. Patient instructed to return in 1 week if needed.

## 2023-08-17 ENCOUNTER — Other Ambulatory Visit: Payer: Self-pay | Admitting: Urology

## 2023-08-17 ENCOUNTER — Encounter (HOSPITAL_COMMUNITY): Payer: Self-pay | Admitting: Radiology

## 2023-08-17 ENCOUNTER — Ambulatory Visit (HOSPITAL_COMMUNITY)
Admission: RE | Admit: 2023-08-17 | Discharge: 2023-08-17 | Disposition: A | Discharge status: Home / Self Care | Source: Ambulatory Visit | Attending: Urology | Admitting: Urology

## 2023-08-17 ENCOUNTER — Other Ambulatory Visit: Payer: Self-pay | Admitting: Interventional Radiology

## 2023-08-17 DIAGNOSIS — Z01818 Encounter for other preprocedural examination: Secondary | ICD-10-CM

## 2023-08-17 HISTORY — PX: IR PATIENT EVAL TECH 0-60 MINS: IMG5564

## 2023-08-17 MED ORDER — HEPARIN SOD (PORK) LOCK FLUSH 100 UNIT/ML IV SOLN
INTRAVENOUS | Status: AC
Start: 2023-08-17 — End: 2023-08-17
  Filled 2023-08-17: qty 5

## 2023-08-17 NOTE — Progress Notes (Signed)
  IR BRIEF PROGRESS NOTE:  Patient presented to IR from Cancer center after receiving Vitamin C infusion for treatment of Hodgkin's Lymphoma. During infusion. Patient's PICC line became clogged, and patient was sent to IR for PICC line evaluation.   Patient was in no acute distress, pleasant, calm. PICC line was appropriately dressed. Heparin  was removed from lines at Cancer center. Both ports on the patient's PICC were accessed in sterile fashion, and power flushed. One port flushed with ease, and returned blood. The second port was initially clogged, but was flushed open without overt effort nor concern, and easily returned blood as well. Both ports were then heparin  locked. Patient tolerated manipulation well, and had no concerns.   Patient was given the department's number, and instructed to reach out with any further concerns.    Electronically Signed: Carlin DELENA Griffon, PA-C 08/17/2023, 12:27 PM

## 2023-08-17 NOTE — Procedures (Signed)
 IR BRIEF PROGRESS NOTE:   Patient presented to IR from Cancer center after receiving Vitamin C infusion for treatment of Hodgkin's Lymphoma. During infusion. Patient's PICC line became clogged, and patient was sent to IR for PICC line evaluation.    Patient was in no acute distress, pleasant, calm. PICC line was appropriately dressed. Heparin  was removed from lines at Cancer center. Both ports on the patient's PICC were accessed in sterile fashion, and power flushed. One port flushed with ease, and returned blood. The second port was initially clogged, but was flushed open without overt effort nor concern, and easily returned blood as well. Both ports were then heparin  locked. Patient tolerated manipulation well, and had no concerns.    Patient was given the department's number, and instructed to reach out with any further concerns.      Electronically Signed: Carlin DELENA Griffon, PA-C 08/17/2023, 12:27 PM

## 2023-08-25 ENCOUNTER — Other Ambulatory Visit: Payer: Self-pay | Admitting: Physical Medicine and Rehabilitation

## 2023-08-25 DIAGNOSIS — J851 Abscess of lung with pneumonia: Secondary | ICD-10-CM

## 2023-08-25 DIAGNOSIS — R590 Localized enlarged lymph nodes: Secondary | ICD-10-CM

## 2023-09-02 ENCOUNTER — Ambulatory Visit
Admission: RE | Admit: 2023-09-02 | Discharge: 2023-09-02 | Disposition: A | Discharge status: Home / Self Care | Source: Ambulatory Visit | Attending: Physical Medicine and Rehabilitation | Admitting: Physical Medicine and Rehabilitation

## 2023-09-02 ENCOUNTER — Encounter: Payer: Self-pay | Admitting: Radiology

## 2023-09-02 DIAGNOSIS — R590 Localized enlarged lymph nodes: Secondary | ICD-10-CM

## 2023-09-02 DIAGNOSIS — J851 Abscess of lung with pneumonia: Secondary | ICD-10-CM

## 2023-09-02 MED ORDER — IOPAMIDOL (ISOVUE-300) INJECTION 61%
75.0000 mL | Freq: Once | INTRAVENOUS | Status: AC | PRN
  Administered 2023-09-02: 75 mL via INTRAVENOUS

## 2023-09-09 ENCOUNTER — Encounter (HOSPITAL_COMMUNITY): Payer: Self-pay | Admitting: Physical Medicine and Rehabilitation

## 2023-09-12 ENCOUNTER — Other Ambulatory Visit (HOSPITAL_COMMUNITY): Payer: Self-pay | Admitting: Physical Medicine and Rehabilitation

## 2023-09-12 DIAGNOSIS — C8599 Non-Hodgkin lymphoma, unspecified, extranodal and solid organ sites: Secondary | ICD-10-CM

## 2023-09-14 ENCOUNTER — Ambulatory Visit (HOSPITAL_COMMUNITY)
Admission: RE | Admit: 2023-09-14 | Discharge: 2023-09-14 | Disposition: A | Discharge status: Home / Self Care | Source: Ambulatory Visit | Attending: Physical Medicine and Rehabilitation | Admitting: Physical Medicine and Rehabilitation

## 2023-09-14 DIAGNOSIS — C8599 Non-Hodgkin lymphoma, unspecified, extranodal and solid organ sites: Secondary | ICD-10-CM | POA: Insufficient documentation

## 2023-09-14 LAB — GLUCOSE, CAPILLARY: Glucose-Capillary: 113 mg/dL — ABNORMAL HIGH (ref 70–99)

## 2023-09-14 MED ORDER — FLUDEOXYGLUCOSE F - 18 (FDG) INJECTION
8.8300 | Freq: Once | INTRAVENOUS | Status: AC
  Administered 2023-09-14: 8.83 via INTRAVENOUS

## 2023-09-14 MED ORDER — HEPARIN SOD (PORK) LOCK FLUSH 100 UNIT/ML IV SOLN
500.0000 [IU] | Freq: Once | INTRAVENOUS | Status: AC
  Administered 2023-09-14: 250 [IU] via INTRAVENOUS
  Filled 2023-09-14: qty 5

## 2023-09-15 ENCOUNTER — Encounter (INDEPENDENT_AMBULATORY_CARE_PROVIDER_SITE_OTHER): Payer: Self-pay

## 2023-09-19 ENCOUNTER — Emergency Department (HOSPITAL_COMMUNITY)
Admission: EM | Admit: 2023-09-19 | Discharge: 2023-09-19 | Disposition: A | Discharge status: Home / Self Care | Source: Ambulatory Visit | Attending: Emergency Medicine | Admitting: Emergency Medicine

## 2023-09-19 ENCOUNTER — Other Ambulatory Visit: Payer: Self-pay

## 2023-09-19 ENCOUNTER — Encounter (HOSPITAL_COMMUNITY): Payer: Self-pay

## 2023-09-19 DIAGNOSIS — R059 Cough, unspecified: Secondary | ICD-10-CM | POA: Diagnosis present

## 2023-09-19 DIAGNOSIS — Z8571 Personal history of Hodgkin lymphoma: Secondary | ICD-10-CM | POA: Diagnosis not present

## 2023-09-19 DIAGNOSIS — J984 Other disorders of lung: Secondary | ICD-10-CM | POA: Insufficient documentation

## 2023-09-19 LAB — CBC WITH DIFFERENTIAL/PLATELET
Abs Immature Granulocytes: 0.18 K/uL — ABNORMAL HIGH (ref 0.00–0.07)
Basophils Absolute: 0 K/uL (ref 0.0–0.1)
Basophils Relative: 0 %
Eosinophils Absolute: 0.1 K/uL (ref 0.0–0.5)
Eosinophils Relative: 1 %
HCT: 39.5 % (ref 36.0–46.0)
Hemoglobin: 11.7 g/dL — ABNORMAL LOW (ref 12.0–15.0)
Immature Granulocytes: 1 %
Lymphocytes Relative: 7 %
Lymphs Abs: 1.1 K/uL (ref 0.7–4.0)
MCH: 25.2 pg — ABNORMAL LOW (ref 26.0–34.0)
MCHC: 29.6 g/dL — ABNORMAL LOW (ref 30.0–36.0)
MCV: 84.9 fL (ref 80.0–100.0)
Monocytes Absolute: 2.2 K/uL — ABNORMAL HIGH (ref 0.1–1.0)
Monocytes Relative: 13 %
Neutro Abs: 13.2 K/uL — ABNORMAL HIGH (ref 1.7–7.7)
Neutrophils Relative %: 78 %
Platelets: 321 K/uL (ref 150–400)
RBC: 4.65 MIL/uL (ref 3.87–5.11)
RDW: 18.6 % — ABNORMAL HIGH (ref 11.5–15.5)
WBC: 16.9 K/uL — ABNORMAL HIGH (ref 4.0–10.5)
nRBC: 0 % (ref 0.0–0.2)

## 2023-09-19 LAB — COMPREHENSIVE METABOLIC PANEL WITH GFR
ALT: 21 U/L (ref 0–44)
AST: 18 U/L (ref 15–41)
Albumin: 3.2 g/dL — ABNORMAL LOW (ref 3.5–5.0)
Alkaline Phosphatase: 190 U/L — ABNORMAL HIGH (ref 38–126)
Anion gap: 14 (ref 5–15)
BUN: 10 mg/dL (ref 6–20)
CO2: 22 mmol/L (ref 22–32)
Calcium: 9.6 mg/dL (ref 8.9–10.3)
Chloride: 98 mmol/L (ref 98–111)
Creatinine, Ser: 0.65 mg/dL (ref 0.44–1.00)
GFR, Estimated: >60 mL/min (ref >60)
Glucose, Bld: 103 mg/dL — ABNORMAL HIGH (ref 70–99)
Potassium: 4 mmol/L (ref 3.5–5.1)
Sodium: 134 mmol/L — ABNORMAL LOW (ref 135–145)
Total Bilirubin: 0.5 mg/dL (ref 0.0–1.2)
Total Protein: 7.3 g/dL (ref 6.5–8.1)

## 2023-09-19 MED ORDER — SODIUM CHLORIDE 0.9 % IV BOLUS
1000.0000 mL | Freq: Once | INTRAVENOUS | Status: AC
  Administered 2023-09-19: 1000 mL via INTRAVENOUS

## 2023-09-19 MED ORDER — VANCOMYCIN HCL 1500 MG/300ML IV SOLN
1500.0000 mg | Freq: Once | INTRAVENOUS | Status: AC
  Administered 2023-09-19: 1500 mg via INTRAVENOUS
  Filled 2023-09-19: qty 300

## 2023-09-19 MED ORDER — SODIUM CHLORIDE 0.9 % IV SOLN
2.0000 g | Freq: Once | INTRAVENOUS | Status: AC
  Administered 2023-09-19: 2 g via INTRAVENOUS
  Filled 2023-09-19: qty 12.5

## 2023-09-19 MED ORDER — HEPARIN SOD (PORK) LOCK FLUSH 100 UNIT/ML IV SOLN
500.0000 [IU] | Freq: Once | INTRAVENOUS | Status: AC
  Administered 2023-09-19: 500 [IU]
  Filled 2023-09-19: qty 5

## 2023-09-19 NOTE — Discharge Instructions (Signed)
 Please follow-up with your family doctor in the office.  Please return if you would like to be reevaluated or if your symptoms worsen.

## 2023-09-19 NOTE — ED Triage Notes (Signed)
 Pt presents to ED from PCP who sent her here for IV abx. Pt reports she has been treated since June for pneumonia, but it is not clearing up so she was sent here for admission.

## 2023-09-19 NOTE — ED Notes (Signed)
 Patient will be ready for discharge once antibiotics are done

## 2023-09-19 NOTE — ED Provider Notes (Signed)
 [place] EMERGENCY DEPARTMENT AT [facility] Provider Note   CSN: [id number] Arrival date & time: 09/19/23  1121     Patient presents with: Cough   Briana Pham is a 38 y.o. female.   38 yo F with a chief complaints of persistent pneumonia.  She has been struggling with a cavitary lesion and has been on multiple rounds of antibiotics for about 2 to 3 months now.  She was a bit more tachycardic than normal and blood pressure was softer and her doctor felt she needed IV antibiotics.  Having some difficulty getting this approved sent her here for IV antibiotic administration.   Cough      Prior to Admission medications   Medication Sig Start Date End Date Taking? Authorizing Provider  ferrous sulfate  325 (65 FE) MG tablet Take 1 tablet (325 mg total) by mouth daily with breakfast. 01/03/22 02/02/22  Shivaji, Lavonia HERO, MD  ibuprofen  (ADVIL ) 800 MG tablet Take 1 tablet (800 mg total) by mouth every 8 (eight) hours as needed. 01/03/22   Shivaji, Lavonia HERO, MD  oxyCODONE  (OXY IR/ROXICODONE ) 5 MG immediate release tablet Take 1 tablet (5 mg total) by mouth every 6 (six) hours as needed for severe pain. 01/03/22   Shivaji, Lavonia HERO, MD  Prenat MV-Min w/Fe-Folate-DHA (PRENATAL COMPLETE PO) Take 1 tablet by mouth daily.    [provider]    Allergies: Azithromycin and Penicillins    Review of Systems  Respiratory:  Positive for cough.     Updated Vital Signs BP 112/69 (BP Location: Left Arm)   Pulse (!) 115   Temp 99.4 F (37.4 C) (Oral)   Resp 18   Ht 5' 7 (1.702 m)   Wt 75.3 kg   SpO2 100%   Breastfeeding No   BMI 26.00 kg/m   Physical Exam Vitals and nursing note reviewed.  Constitutional:      General: She is not in acute distress.    Appearance: She is well-developed. She is not diaphoretic.  HENT:     Head: Normocephalic and atraumatic.  Eyes:     Pupils: Pupils are equal, round, and reactive to light.  Cardiovascular:     Rate and  Rhythm: Normal rate and regular rhythm.     Heart sounds: No murmur heard.    No friction rub. No gallop.  Pulmonary:     Effort: Pulmonary effort is normal.     Breath sounds: No wheezing or rales.  Abdominal:     General: There is no distension.     Palpations: Abdomen is soft.     Tenderness: There is no abdominal tenderness.  Musculoskeletal:        General: No tenderness.     Cervical back: Normal range of motion and neck supple.  Skin:    General: Skin is warm and dry.  Neurological:     Mental Status: She is alert and oriented to person, place, and time.  Psychiatric:        Behavior: Behavior normal.     (all labs ordered are listed, but only abnormal results are displayed) Labs Reviewed  CBC WITH DIFFERENTIAL/PLATELET - Abnormal; Notable for the following components:      Result Value   WBC 16.9 (*)    Hemoglobin 11.7 (*)    MCH 25.2 (*)    MCHC 29.6 (*)    RDW 18.6 (*)    Neutro Abs 13.2 (*)    Monocytes Absolute 2.2 (*)  Abs Immature Granulocytes 0.18 (*)    All other components within normal limits  COMPREHENSIVE METABOLIC PANEL WITH GFR - Abnormal; Notable for the following components:   Sodium 134 (*)    Glucose, Bld 103 (*)    Albumin 3.2 (*)    Alkaline Phosphatase 190 (*)    All other components within normal limits  CULTURE, BLOOD (ROUTINE X 2)  CULTURE, BLOOD (ROUTINE X 2)  I-STAT CG4 LACTIC ACID, ED    EKG: None  Radiology: No results found.   Procedures   Medications Ordered in the ED  vancomycin  (VANCOREADY) IVPB 1500 mg/300 mL (1,500 mg Intravenous New Bag/Given 09/19/23 1309)  ceFEPIme  (MAXIPIME ) 2 g in sodium chloride  0.9 % 100 mL IVPB (0 g Intravenous Stopped 09/19/23 1300)  sodium chloride  0.9 % bolus 1,000 mL (1,000 mLs Intravenous New Bag/Given 09/19/23 1224)                                    Medical Decision Making Amount and/or Complexity of Data Reviewed Labs: ordered.  Risk Prescription drug management.   38 yo F  with a significant past history of Hodgkin's lymphoma declining medical therapy comes in with a chief complaint of a left upper lobe lung lesion.  Has been on multiple rounds of antibiotics including Levaquin, doxycycline, azithromycin, linezolid.  She was seen by her doctor today and they felt she was doing worse and was felt she benefit from IV antibiotics.  I discussed the case with her physician on the phone.  Sending her for IV antibiotics and they would attempt to get initiation of antibiotic therapy at home.  I discussed this with the patient.  She would prefer not to have any imaging done at this time.  Is willing to have blood work performed.  Will start on vancomycin  and cefepime .  No leukocytosis, no acute anemia, no significant electrolyte abnormalities.  Patient feeling better after bag of IV fluids.  Blood pressure is also improved.  The patient would like to go home at this time.  PCP follow-up.  2:16 PM:  I have discussed the diagnosis/risks/treatment options with the patient and family.  Evaluation and diagnostic testing in the emergency department does not suggest an emergent condition requiring admission or immediate intervention beyond what has been performed at this time.  They will follow up with PCP. We also discussed returning to the ED immediately if new or worsening sx occur. We discussed the sx which are most concerning (e.g., sudden worsening pain, fever, inability to tolerate by mouth) that necessitate immediate return. Medications administered to the patient during their visit and any new prescriptions provided to the patient are listed below.  Medications given during this visit Medications  vancomycin  (VANCOREADY) IVPB 1500 mg/300 mL (1,500 mg Intravenous New Bag/Given 09/19/23 1309)  ceFEPIme  (MAXIPIME ) 2 g in sodium chloride  0.9 % 100 mL IVPB (0 g Intravenous Stopped 09/19/23 1300)  sodium chloride  0.9 % bolus 1,000 mL (1,000 mLs Intravenous New Bag/Given 09/19/23 1224)      The patient appears reasonably screen and/or stabilized for discharge and I doubt any other medical condition or other [facility] requiring further screening, evaluation, or treatment in the ED at this time prior to discharge.       Final diagnoses:  Cavitary lesion of lung    ED Discharge Orders     None          Emil,  Jassen Sarver, DO 09/19/23 1416

## 2023-09-24 LAB — CULTURE, BLOOD (ROUTINE X 2)
Culture: NO GROWTH
Culture: NO GROWTH
Special Requests: ADEQUATE

## 2023-10-10 ENCOUNTER — Telehealth (INDEPENDENT_AMBULATORY_CARE_PROVIDER_SITE_OTHER): Payer: Self-pay | Admitting: Otolaryngology

## 2023-10-10 ENCOUNTER — Ambulatory Visit (INDEPENDENT_AMBULATORY_CARE_PROVIDER_SITE_OTHER): Admitting: Otolaryngology

## 2023-10-10 VITALS — BP 107/66 | HR 86 | Temp 99.2°F | Ht 67.0 in | Wt 170.0 lb

## 2023-10-10 DIAGNOSIS — R490 Dysphonia: Secondary | ICD-10-CM

## 2023-10-10 DIAGNOSIS — J3801 Paralysis of vocal cords and larynx, unilateral: Secondary | ICD-10-CM

## 2023-10-10 DIAGNOSIS — C8191 Hodgkin lymphoma, unspecified, lymph nodes of head, face, and neck: Secondary | ICD-10-CM

## 2023-10-10 DIAGNOSIS — Z87891 Personal history of nicotine dependence: Secondary | ICD-10-CM | POA: Diagnosis not present

## 2023-10-10 DIAGNOSIS — J383 Other diseases of vocal cords: Secondary | ICD-10-CM

## 2023-10-10 NOTE — Progress Notes (Signed)
 ENT CONSULT:  Reason for Consult: chronic hoarseness    HPI: Discussed the use of AI scribe software for clinical note transcription with the patient, who gave verbal consent to proceed.  History of Present Illness Briana Pham is a 38 year old female with hx of lymphoma who presents with changes to her voice/chronic dysphonia. She is accompanied by her sister.   She noticed changes to her voice starting in May or June, following an episode of cavitary pneumonia on the left side. The pneumonia was managed at home with various antibiotics, initially in pill form, which were ineffective, and subsequently with IV antibiotics. Her last dose of IV antibiotics was administered yesterday morning. During the peak of her pneumonia, she experienced significant vomiting and coughing. Despite the resolution of other symptoms, her voice has not returned to normal.  She underwent a PET scan two to three weeks ago, along with several CT scans and chest x-rays. The PET scan was conducted due to her history of lymphoma, diagnosed last year, for which she is still undergoing treatment for. The scan revealed some lymph nodes along the left side of her neck and chest.  No history of pneumonias prior to this episode and no current breathing difficulties. No known allergies or reflux issues and is not on any medication for these conditions. She has not seen a pulmonary doctor for her pneumonia.   Records Reviewed:  ED visit 09/19/23 38 yo F with a chief complaints of persistent pneumonia.  She has been struggling with a cavitary lesion and has been on multiple rounds of antibiotics for about 2 to 3 months now.  She was a bit more tachycardic than normal and blood pressure was softer and her doctor felt she needed IV antibiotics.  Having some difficulty getting this approved sent her here for IV antibiotic administration.      Past Medical History:  Diagnosis Date   Anxiety    Arrest of dilation, delivered,  current hospitalization 12/10/2018   Asthma    inhaler used in 2017   Insomnia    Mental disorder    Pregnancy induced hypertension     Past Surgical History:  Procedure Laterality Date   CESAREAN SECTION N/A 12/10/2018   Procedure: CESAREAN SECTION;  Surgeon: Delana Ted Morrison, DO;  Location: [facility] LD ORS;  Service: Obstetrics;  Laterality: N/A;   CESAREAN SECTION N/A 01/01/2022   Procedure: CESAREAN SECTION;  Surgeon: Delana Ted Morrison, DO;  Location: [facility] LD ORS;  Service: Obstetrics;  Laterality: N/A;  request RNFA or Fellow   HYSTEROSCOPY     IR PATIENT EVAL TECH 0-60 MINS  08/09/2023   IR PATIENT EVAL TECH 0-60 MINS  08/17/2023   ivf     MOUTH SURGERY     WISDOM TOOTH EXTRACTION      Family History  Problem Relation Age of Onset   Multiple sclerosis Mother    Heart disease Father    Stroke Father     Social History:  reports that she has quit smoking. Her smoking use included cigarettes. She has never used smokeless tobacco. She reports that she does not drink alcohol and does not use drugs.  Allergies:  Allergies  Allergen Reactions   Azithromycin Anaphylaxis and Itching    Rash, crawly skin, could not form sentences Other reaction(s): Confusion   Penicillins Hives and Itching    Confusion Other reaction(s): Confusion ALL CILLIN FAMILY*    Medications: I have reviewed the patient's current medications.  The PMH,  PSH, Medications, Allergies, and SH were reviewed and updated.  ROS: Constitutional: Negative for fever, weight loss and weight gain. Cardiovascular: Negative for chest pain and dyspnea on exertion. Respiratory: Is not experiencing shortness of breath at rest. Gastrointestinal: Negative for nausea and vomiting. Neurological: Negative for headaches. Psychiatric: The patient is not nervous/anxious  Blood pressure 107/66, pulse 86, temperature 99.2 F (37.3 C), height 5' 7 (1.702 m), weight 170 lb (77.1 kg), SpO2 93%, not currently breastfeeding.  Body mass index is 26.63 kg/m.  PHYSICAL EXAM:  Exam: General: Well-developed, well-nourished Communication and Voice: poor projection  Respiratory Respiratory effort: Equal inspiration and expiration without stridor Cardiovascular Peripheral Vascular: Warm extremities with equal color/perfusion Eyes: No nystagmus with equal extraocular motion bilaterally Neuro/Psych/Balance: Patient oriented to person, place, and time; Appropriate mood and affect; Gait is intact with no imbalance; Cranial nerves I-XII are intact Head and Face Inspection: Normocephalic and atraumatic without mass or lesion Palpation: Facial skeleton intact without bony stepoffs Salivary Glands: No mass or tenderness Facial Strength: Facial motility symmetric and full bilaterally ENT Pinna: External ear intact and fully developed External canal: Canal is patent with intact skin Tympanic Membrane: Clear and mobile External Nose: No scar or anatomic deformity Internal Nose: Septum is straight. No polyp, or purulence. Mucosal edema and erythema present.  Bilateral inferior turbinate hypertrophy.  Lips, Teeth, and gums: Mucosa and teeth intact and viable TMJ: No pain to palpation with full mobility Oral cavity/oropharynx: No erythema or exudate, no lesions present Nasopharynx: No mass or lesion with intact mucosa Hypopharynx: Intact mucosa without pooling of secretions Larynx Glottic: Full true vocal cord movement on the right and paralysis on the left, no lesions or masses Supraglottic: Normal appearing epiglottis and AE folds Interarytenoid Space: Moderate pachydermia&edema Subglottic Space: Patent without lesion or edema Neck Neck and Trachea: Midline trachea without mass or lesion Thyroid: No mass or nodularity Lymphatics: No lymphadenopathy  Procedure:  Preoperative diagnosis: hoarseness  Postoperative diagnosis:   same + L VF paralysis   Procedure: Flexible fiberoptic laryngoscopy with stroboscopy  (68420)   Surgeon: Verenise Moulin, MD  Anesthesia: Topical lidocaine  and Afrin  Complications: None  Condition is stable throughout exam  Indications and consent:   The patient presents to the clinic with hoarseness. All the risks, benefits, and potential complications were reviewed with the patient preoperatively and informed verbal consent was obtained.  Procedure: The patient was seated upright in the exam chair.   Topical lidocaine  and Afrin were applied to the nasal cavity. After adequate anesthesia had occurred, the flexible telescope with strobe capabilities was passed into the nasal cavity. The nasopharynx was patent without mass or lesion. The scope was passed behind the soft palate and directed toward the base of tongue. The base of tongue was visualized and was symmetric with no apparent masses or abnormal appearing tissue. There were no signs of a mass or pooling of secretions in the piriform sinuses. The supraglottic structures were normal.  The true vocal cords showed left VF paralysis. The medial edges were straight. Closure was incomplete. Periodicity present. The mucosal wave and amplitude were intact and normal. There is moderate interarytenoid pachydermia and post cricoid edema. The mucosa appears without lesions. The laryngoscope was then slowly withdrawn and the patient tolerated the procedure well. There were no complications or blood loss.  Studies Reviewed: PET/CT 09/14/23 IMPRESSION: 1. Deauville 5 hypermetabolic adenopathy in the neck, chest, abdomen, and pelvis compatible with lymphoma. 2. Hypermetabolic cavitary mass in the left upper lobe with additional  scattered pulmonary nodules of variable metabolic activity. 3. Hypermetabolic splenic lesions. 4. Small to moderate left pleural effusion. 5. Mild cardiomegaly. 6. Chronic bilateral maxillary sinusitis.  Assessment/Plan: Encounter Diagnoses  Name Primary?   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 Left vocal cord immobile, causing incomplete closure and hoarseness. Potential improvement if underlying cause resolves, recovery may take up to a year. We discussed observation vs Restylane injection augmentation.  - Provided information on filler injections for vocal cords, including procedure details and expected outcomes. Filler injections involve hyaluronic acid gel to enhance vocal cord closure and voice projection. Initial post-procedure hoarseness expected, with improvement over weeks. Procedure takes 10-15 minutes to set up and a few minutes to perform. - Schedule follow-up in 8 months to assess vocal cord movement. - Consider injection augmentation for better voice and cough quality.       Thank you for allowing me to participate in the care of this patient. Please do not hesitate to contact me with any questions or concerns.   Elena Larry, MD Otolaryngology [facility] Phone: [phone] Fax: [phone]    10/10/2023, 9:31 AM

## 2023-10-10 NOTE — Telephone Encounter (Signed)
 The patient wanted to wait and think about doing the Procedure restylane injection VF and not schedule right now.  She will call back.

## 2023-10-10 NOTE — Patient Instructions (Signed)

## 2023-10-26 ENCOUNTER — Other Ambulatory Visit: Payer: Self-pay

## 2023-10-26 ENCOUNTER — Observation Stay (HOSPITAL_COMMUNITY)
Admission: EM | Admit: 2023-10-26 | Discharge: 2023-10-26 | Disposition: A | Discharge status: Home / Self Care | Attending: Internal Medicine | Admitting: Internal Medicine

## 2023-10-26 ENCOUNTER — Emergency Department (HOSPITAL_COMMUNITY)

## 2023-10-26 ENCOUNTER — Telehealth: Payer: Self-pay | Admitting: Pulmonary Disease

## 2023-10-26 ENCOUNTER — Encounter (HOSPITAL_COMMUNITY): Payer: Self-pay

## 2023-10-26 DIAGNOSIS — R0602 Shortness of breath: Secondary | ICD-10-CM | POA: Diagnosis present

## 2023-10-26 DIAGNOSIS — R911 Solitary pulmonary nodule: Secondary | ICD-10-CM

## 2023-10-26 DIAGNOSIS — D72829 Elevated white blood cell count, unspecified: Principal | ICD-10-CM

## 2023-10-26 DIAGNOSIS — J189 Pneumonia, unspecified organism: Secondary | ICD-10-CM

## 2023-10-26 DIAGNOSIS — C8190 Hodgkin lymphoma, unspecified, unspecified site: Secondary | ICD-10-CM

## 2023-10-26 DIAGNOSIS — J3801 Paralysis of vocal cords and larynx, unilateral: Secondary | ICD-10-CM

## 2023-10-26 DIAGNOSIS — J984 Other disorders of lung: Secondary | ICD-10-CM

## 2023-10-26 DIAGNOSIS — Z87891 Personal history of nicotine dependence: Secondary | ICD-10-CM

## 2023-10-26 HISTORY — DX: Non-Hodgkin lymphoma, unspecified, unspecified site: C85.90

## 2023-10-26 HISTORY — DX: Paralysis of vocal cords and larynx, unilateral: J38.01

## 2023-10-26 HISTORY — DX: Other disorders of lung: J98.4

## 2023-10-26 LAB — BASIC METABOLIC PANEL WITH GFR
Anion gap: 11 (ref 5–15)
BUN: 11 mg/dL (ref 6–20)
CO2: 26 mmol/L (ref 22–32)
Calcium: 9.3 mg/dL (ref 8.9–10.3)
Chloride: 101 mmol/L (ref 98–111)
Creatinine, Ser: 0.6 mg/dL (ref 0.44–1.00)
GFR, Estimated: >60 mL/min (ref >60)
Glucose, Bld: 136 mg/dL — ABNORMAL HIGH (ref 70–99)
Potassium: 3.9 mmol/L (ref 3.5–5.1)
Sodium: 139 mmol/L (ref 135–145)

## 2023-10-26 LAB — CBC
HCT: 34.3 % — ABNORMAL LOW (ref 36.0–46.0)
Hemoglobin: 9.7 g/dL — ABNORMAL LOW (ref 12.0–15.0)
MCH: 24 pg — ABNORMAL LOW (ref 26.0–34.0)
MCHC: 28.3 g/dL — ABNORMAL LOW (ref 30.0–36.0)
MCV: 84.7 fL (ref 80.0–100.0)
Platelets: 346 K/uL (ref 150–400)
RBC: 4.05 MIL/uL (ref 3.87–5.11)
RDW: 21.2 % — ABNORMAL HIGH (ref 11.5–15.5)
WBC: 17.1 K/uL — ABNORMAL HIGH (ref 4.0–10.5)
nRBC: 0 % (ref 0.0–0.2)

## 2023-10-26 LAB — CRYPTOCOCCAL ANTIGEN: Crypto Ag: NEGATIVE

## 2023-10-26 LAB — HCG, SERUM, QUALITATIVE: Preg, Serum: NEGATIVE

## 2023-10-26 LAB — HIV ANTIBODY (ROUTINE TESTING W REFLEX): HIV Screen 4th Generation wRfx: NONREACTIVE

## 2023-10-26 MED ORDER — ALBUTEROL SULFATE (2.5 MG/3ML) 0.083% IN NEBU: 2.5000 mg | INHALATION_SOLUTION | RESPIRATORY_TRACT | Status: DC | PRN

## 2023-10-26 MED ORDER — ACETAMINOPHEN 650 MG RE SUPP: 650.0000 mg | Freq: Four times a day (QID) | RECTAL | Status: DC | PRN

## 2023-10-26 MED ORDER — ONDANSETRON HCL 4 MG/2ML IJ SOLN: 4.0000 mg | Freq: Four times a day (QID) | INTRAMUSCULAR | Status: DC | PRN

## 2023-10-26 MED ORDER — ONDANSETRON HCL 4 MG PO TABS: 4.0000 mg | ORAL_TABLET | Freq: Four times a day (QID) | ORAL | Status: DC | PRN

## 2023-10-26 MED ORDER — TRAZODONE HCL 50 MG PO TABS: 25.0000 mg | ORAL_TABLET | Freq: Every evening | ORAL | Status: DC | PRN

## 2023-10-26 MED ORDER — ALTEPLASE 2 MG IJ SOLR
2.0000 mg | Freq: Once | INTRAMUSCULAR | Status: AC
  Administered 2023-10-26: 2 mg
  Filled 2023-10-26: qty 2

## 2023-10-26 MED ORDER — ACETAMINOPHEN 325 MG PO TABS: 650.0000 mg | ORAL_TABLET | Freq: Four times a day (QID) | ORAL | Status: DC | PRN

## 2023-10-26 NOTE — Telephone Encounter (Signed)
 Working to schedule bronchoscopy with BAL of Briana Pham hospital 10/28/2023 with Dr. Annella.  If unable, can schedule with Dr. Claudene at [facility] 10/28/2023.  Please schedule the following:  Provider performing procedure: Dr. Annella or Dr. Claudene Diagnosis: Cavitary lung lesion Which side if for nodule / mass?  Left Procedure: Bronchoscopy with BAL Has patient been spoken to by Provider and given informed consent?  Yes Anesthesia: General Do you need Fluro?  No Duration of procedure: 30 minutes Date: 10/28/2023 Alternate Date: N/A Time: Whenever available Location: Prefer Briana Pham with Dr. Annella, I been able to accommodate him at [facility] with Dr. Claudene Does patient have OSA?  No DM?  No or Latex allergy?  No Medication Restriction/ Anticoagulate/Antiplatelet: No Pre-op Labs Ordered:determined by Anesthesia Imaging request: N/A (If, SuperDimension CT Chest, please have STAT courier sent to ENDO)

## 2023-10-26 NOTE — ED Provider Notes (Signed)
 Briana Pham Provider Note   CSN: [id number] Arrival date & time: 10/26/23  9089     Patient presents with: Shortness of Breath   Briana Pham is a 38 y.o. female.   76 female with past medical history of lymphoma who is currently undergoing treatment with left-sided cavitary lesion with linezolid presenting to the emergency department today with concern for elevated leukocytosis despite being on antibiotics.  The patient been on multiple rounds of different antibiotics.  She does have a penicillin allergy.  She apparently has blood work drawn every week and her white blood count was elevated compared to her previous weeks that she was told to come to the ER for further evaluation.  The patient herself states she is feeling well.  She states she is having some very minimal mucus production over the past 2 days but denies any fevers, weakness, or shortness of breath.  She was told to come in for further evaluation.  She is currently on linezolid twice daily.   Shortness of Breath      Prior to Admission medications   Medication Sig Start Date End Date Taking? Authorizing Provider  ferrous sulfate  325 (65 FE) MG tablet Take 1 tablet (325 mg total) by mouth daily with breakfast. 01/03/22 10/10/23  Briana Pham  ibuprofen  (ADVIL ) 800 MG tablet Take 1 tablet (800 mg total) by mouth every 8 (eight) hours as needed. 01/03/22   Briana Pham  oxyCODONE  (OXY IR/ROXICODONE ) 5 MG immediate release tablet Take 1 tablet (5 mg total) by mouth every 6 (six) hours as needed for severe pain. 01/03/22   Briana Pham  Prenat MV-Min w/Fe-Folate-DHA (PRENATAL COMPLETE PO) Take 1 tablet by mouth daily.    Provider, Historical, Pham    Allergies: Azithromycin and Penicillins    Review of Systems  Respiratory:  Positive for shortness of breath.   All other systems reviewed and are negative.   Updated Vital Signs BP 123/78    Pulse 84   Temp 98.1 F (36.7 C) (Oral)   Resp 16   SpO2 95%   Physical Exam Vitals and nursing note reviewed.   Gen: NAD Eyes: PERRL, EOMI HEENT: no oropharyngeal swelling Neck: trachea midline Resp: clear to auscultation bilaterally Card: RRR, no murmurs, rubs, or gallops Abd: nontender, nondistended Extremities: no calf tenderness, no edema Vascular: 2+ radial pulses bilaterally, 2+ DP pulses bilaterally Skin: no rashes Psyc: acting appropriately   (all labs ordered are listed, but only abnormal results are displayed) Labs Reviewed  BLOOD CULTURE ID PANEL (REFLEXED) - BCID2 - Abnormal; Notable for the following components:      Result Value   Staphylococcus species DETECTED (*)    Staphylococcus epidermidis DETECTED (*)    Methicillin resistance mecA/C DETECTED (*)    All other components within normal limits  BASIC METABOLIC PANEL WITH GFR - Abnormal; Notable for the following components:   Glucose, Bld 136 (*)    All other components within normal limits  CBC - Abnormal; Notable for the following components:   WBC 17.1 (*)    Hemoglobin 9.7 (*)    HCT 34.3 (*)    MCH 24.0 (*)    MCHC 28.3 (*)    RDW 21.2 (*)    All other components within normal limits  CULTURE, BLOOD (ROUTINE X 2)  CULTURE, BLOOD (ROUTINE X 2)  HCG, SERUM, QUALITATIVE  CRYPTOCOCCAL ANTIGEN  TOXOPLASMA ANTIBODIES- IGG AND  IGM  HIV ANTIBODY (ROUTINE TESTING W REFLEX)  QUANTIFERON-TB GOLD PLUS    EKG: EKG Interpretation Date/Time:  Wednesday October 26 2023 09:20:32 EDT Ventricular Rate:  112 PR Interval:  110 QRS Duration:  103 QT Interval:  336 QTC Calculation: 461 R Axis:   94  Text Interpretation: Sinus tachycardia Nonspecific ST abnormality Technically poor tracing Confirmed by Briana Barter [phone]) on 10/26/2023 9:23:08 AM  Radiology: No results found.   Procedures   Medications Ordered in the ED  alteplase (CATHFLO ACTIVASE) injection 2 mg (2 mg Intracatheter Given  10/26/23 1148)                                    Medical Decision Making 38 year old female with past medical history of lymphoma, asthma, and vocal cord paralysis with left-sided cavitary lesion presenting to the emergency department today with concern for leukocytosis.  I will further evaluate the patient here with repeat labs and blood cultures.  Will also repeat an x-ray to evaluate for potential worsening.  Patient herself is feeling well here.  Will call discuss her case after her workup is complete to determine ultimate disposition as clinically it seems that she is improving on linezolid despite the leukocytosis.  In speaking with the patient more it seems that she is actually being treated with nonconventional treatments for her lymphoma.  She has been managed by her PCP/physiatrist here for her IV antibiotics.  She has not seen an oncologist or infectious disease doctor.  Today: Discussed this with infectious disease.  I discussed the patient's case with her primary care provider as well as infectious disease.  Infectious ease recommendation is for admission for cultures and pulmonology evaluation.  A call was placed to hospitalist service for admission.  Amount and/or Complexity of Data Reviewed Labs: ordered. Radiology: ordered.  Risk Prescription drug management. Decision regarding hospitalization.        Final diagnoses:  Leukocytosis, unspecified type  Pneumonia due to infectious organism, unspecified laterality, unspecified part of lung    ED Discharge Orders     None          Briana Barter SAUNDERS, Pham 10/29/23 [phone]

## 2023-10-26 NOTE — H&P (View-Only) (Signed)
 NAME:  Briana Pham, MRN:  [id number], DOB:  [birth year], LOS: 0 ADMISSION DATE:  10/26/2023 CONSULTATION DATE:  10/26/2023 REFERRING MD:  Zella - [facility], CHIEF COMPLAINT:  Cavitary lesion   History of Present Illness:  38 year old woman who presented to [facility] ED 10/22 for SOB, cough x 4 months. PMHx significant for asthma, insomnia, anxiety, presumed Hodgkin's lymphoma (diagnosed 05/2022, on nontraditional treatment), cavitary lesion of LUL of lung, vocal cord paralysis (unilateral, L). Patient is primarily followed by Dr. Elouise Brooking.  Patient was diagnosed with concern for lymphoma 05/2022. Initially established with Dr. Rudell Bud with [facility]; US  Breast Bx completed with suspicion for involvement by classic Hodgkin's lymphoma. Underwent PET/CT 05/2022 demonstrating hypermetabolic LN above/below the diaphragm consistent with known lymphoma and diffuse FDG uptake in bilateral breast, most on L with hypermetabolic L paraspinal soft tissue thickening ~T4. MRI was recommended; unclear if this was ever obtained as patient was lost to follow up. Patient refused traditional treatment options (radiation/chemotherapy) to pursue nontraditional/holistic options via Dr. Brooking [facility]).   Patient developed a cough in 06/2023 prompting workup. CXR 07/28/2023 demonstrated possible large cavitating lesion of medial LUL c/f cavitary PNA versus necrotic malignancy. CT Chest 08/02/2023 demonstrated large, thick-walled cavitary lesion of medial L apex with numerous additional smaller nodules/opacities (also cavitary) with extensive, confluent matted soft tissue in the anterior mediastinum/L hilum with enlarged LN. PICC line was placed 08/04/2023 for ongoing infusion therapies including Vitamin C. Multiple antibiotic courses have been prescribed and completed with no improvement, to include several PO courses (azithromycin, doxycycline, levofloxacin, linezolid).  Repeat CT scan 09/04/2023  unchanged on my review interpretation.  Patient was sent to ED by PCP 9/15 for IV antibiotic administration for persistent cough, ?PNA. Of note, patient saw Dr. Soldutova 10/6 for chronic hoarseness and was found to have unilateral L-sided vocal cord paralysis. Repeat CT Chest 8/31 grossly unchanged with regard to thick-walled LUL cavitary lesion/bulky LAD. PET/CT 9/10 showed hypermetabolic LAD of neck/chest/abdomen/pelvis c/w lymphoma, hypermetabolic cavitary mass LUL with scattered nodules and hypermetabolic splenic lesions.  Patient ultimately presented to [facility] ED 10/22 for leukocytosis via PCP despite multiple courses of antibiotics. On ED arrival, patient was afebrile with HR 98, BP 145/75, SpO2 100% on RA. Labs were notable for WBC 17.1, Hgb 9.7, Plt 346. Na 139, K 3.9, CO2 26, Cr 0.60. CXR with stable cavitary process of LUL, stable patchy opacification. Admitted by TRH for further evaluation and workup.  PCCM consulted for pulmonary input and evaluation of LUL cavitary lesion.  Pertinent Medical History:   Past Medical History:  Diagnosis Date   Anxiety    Arrest of dilation, delivered, current hospitalization 12/10/2018   Asthma    inhaler used in 2017   Cavitary lesion of lung    LUL   Insomnia    Lymphoma (HCC)    Mental disorder    Pregnancy induced hypertension    Unilateral vocal cord paralysis    Left   Significant Hospital Events: Including procedures, antibiotic start and stop dates in addition to other pertinent events   10/22 - Presented to [facility] ED for cough/SOB x 4 months. CXR with stable cavitary process of LUL, stable patchy opacification. Admitted by TRH for further evaluation and workup. PCCM consulted.  Interim History / Subjective:  Pulmonary consulted for evaluation of LUL cavitary lesion.  Objective:  Blood pressure 123/78, pulse 84, temperature 98.1 F (36.7 C), temperature source Oral, resp. rate 16, SpO2 95%, not currently breastfeeding.  No  intake or output data in the 24 hours ending 10/26/23 1522 There were no vitals filed for this visit.  Physical Examination: General: Well appearing sitting up in chair HEENT: [place]/AT, anicteric sclera, PERRL, moist mucous membranes. Neuro: Awake, oriented x 3.  No focal deficits   CV: RRR, no m/g/r. PULM: Breathing even and unlabored on room air. Lung fields bronchial breath sounds on the left, clear, normal on the right. GI: Nondistended Skin: Warm/dry, no lesions or rash noted.  Resolved Hospital Problem List:    Assessment & Plan:   Cavitary lesion of left upper lobe of lung Hodgkin's lymphoma Bulky lymphadenopathy 2/2 lymphoma Chronic hoarseness 2/2 vocal cord paralysis, unilateral Lung nodules Cavitary upper lung lesion is the primary concern.  Overall stable on serial chest x-rays and CT scans, PET scan.  No risk factors for TB.  No B symptoms.  Minimal cough.  TB felt unlikely.  I suspect this is sequela of prior infection or pneumonia.  With subsequent development of bronchiectasis and cavitary lesion.  Thickened cavitary lesion raises high suspicion for mycetoma.  Likely developed in the setting of her immunosuppression, relative immunosuppression in setting of presumed Hodgkin's lymphoma.  Fortunately she is well-appearing.  Minimal cough.  A bit worse over the last 2 weeks but not ill-appearing.  Has completed multiple rounds of antibiotics with intermittent improvement.  I do not think current presentation is consistent with a bacterial infection.  Other scattered lung nodules are mildly PET avid, high suspicion for foci of lymphoma. -- Appreciate ID assistance -- Follow-up QuantiFERON gold, toxoplasma, cryptococcal tests -- Cannot cough up sputum -- Discussed role and rationale for bronchoscopy, its risks and benefits, in terms of BAL and trying to tear with Aspergillus is present, she expressed understanding and agrees to move forward with bronchoscopy and BAL -- Briefly  discussed difficulty in treating this with antifungals and ultimate often final solution of surgical resection  Discussed case at length with hospitalist.  Discussed with colleague in pulmonary.  Will plan for bronchoscopy and BAL as an outpatient specifically looking for fungal and Aspergillus elements 10/28/2023 at [place] [facility].  If this cannot be accommodated we will work to get that scheduled at [facility] and performed by colleagues there.  This was discussed in detail with the patient and she expressed understanding and agreement with plan.  Labs:  CBC: Recent Labs  Lab 10/26/23 1057  WBC 17.1*  HGB 9.7*  HCT 34.3*  MCV 84.7  PLT 346   Basic Metabolic Panel: Recent Labs  Lab 10/26/23 1057  NA 139  K 3.9  CL 101  CO2 26  GLUCOSE 136*  BUN 11  CREATININE 0.60  CALCIUM  9.3   GFR: CrCl cannot be calculated (Unknown ideal weight.). Recent Labs  Lab 10/26/23 1057  WBC 17.1*   Liver Function Tests: No results for input(s): AST, ALT, ALKPHOS, BILITOT, PROT, ALBUMIN in the last 168 hours. No results for input(s): LIPASE, AMYLASE in the last 168 hours. No results for input(s): AMMONIA in the last 168 hours.  ABG: No results found for: PHART, PCO2ART, PO2ART, HCO3, TCO2, ACIDBASEDEF, O2SAT   Coagulation Profile: No results for input(s): INR, PROTIME in the last 168 hours.  Cardiac Enzymes: No results for input(s): CKTOTAL, CKMB, CKMBINDEX, TROPONINI in the last 168 hours.  HbA1C: No results found for: HGBA1C  CBG: No results for input(s): GLUCAP in the last 168 hours.  Review of Systems:   No chest pain.  No orthopnea or PND.  Comprehensive review of systems otherwise negative unless mentioned in HPI above.  Past Medical History:  She,  has a past medical history of Anxiety, Arrest of dilation, delivered, current hospitalization (12/10/2018), Asthma, Cavitary lesion of lung, Insomnia, Lymphoma (HCC), Mental disorder,  Pregnancy induced hypertension, and Unilateral vocal cord paralysis.   Surgical History:   Past Surgical History:  Procedure Laterality Date   CESAREAN SECTION N/A 12/10/2018   Procedure: CESAREAN SECTION;  Surgeon: Delana Ted Morrison, DO;  Location: [facility] LD ORS;  Service: Obstetrics;  Laterality: N/A;   CESAREAN SECTION N/A 01/01/2022   Procedure: CESAREAN SECTION;  Surgeon: Delana Ted Morrison, DO;  Location: [facility] LD ORS;  Service: Obstetrics;  Laterality: N/A;  request RNFA or Fellow   HYSTEROSCOPY     IR PATIENT EVAL TECH 0-60 MINS  08/09/2023   IR PATIENT EVAL TECH 0-60 MINS  08/17/2023   ivf     MOUTH SURGERY     WISDOM TOOTH EXTRACTION      Social History:   reports that she has quit smoking. Her smoking use included cigarettes. She has never used smokeless tobacco. She reports that she does not drink alcohol and does not use drugs.   Family History:  Her family history includes Heart disease in her father; Multiple sclerosis in her mother; Stroke in her father.   Allergies: Allergies  Allergen Reactions   Azithromycin Anaphylaxis and Itching    Rash, crawly skin, could not form sentences Other reaction(s): Confusion   Penicillins Hives and Itching    Confusion Other reaction(s): Confusion ALL CILLIN FAMILY*   Home Medications: Prior to Admission medications   Medication Sig Start Date End Date Taking? Authorizing Provider  ferrous sulfate  325 (65 FE) MG tablet Take 1 tablet (325 mg total) by mouth daily with breakfast. 01/03/22 10/10/23  Shivaji, Lavonia HERO, MD  ibuprofen  (ADVIL ) 800 MG tablet Take 1 tablet (800 mg total) by mouth every 8 (eight) hours as needed. 01/03/22   Shivaji, Lavonia HERO, MD  oxyCODONE  (OXY IR/ROXICODONE ) 5 MG immediate release tablet Take 1 tablet (5 mg total) by mouth every 6 (six) hours as needed for severe pain. 01/03/22   Shivaji, Lavonia HERO, MD  Prenat MV-Min w/Fe-Folate-DHA (PRENATAL COMPLETE PO) Take 1 tablet by mouth daily.    [provider]   Signature:    Donnice JONELLE Beals, MD [facility] 10/26/23 3:22 PM  Please see [email or web address] for contact info  From 7A-7P if no response, please call [phone] After hours, please call ELink [phone]

## 2023-10-26 NOTE — Discharge Summary (Signed)
 Discharge Summary  Briana Pham FMW:[id number] DOB: September 13, 1985  PCP: Ambrosio Elouise HERO, MD  Admit date: 10/26/2023 Discharge date: 10/26/2023   Recommendations for Outpatient Follow-up:  Please follow up with your PCP with CBC and BMP in 1-2 weeks  Discharge Diagnoses:  Active Hospital Problems   Diagnosis Date Noted   Cavitary lesion of lung 10/26/2023    Resolved Hospital Problems  No resolved problems to display.   Discharge Condition: Stable   Diet recommendation: Diet Orders (From admission, onward)     Start     Ordered   10/26/23 1349  Diet regular Room service appropriate? Yes; Fluid consistency: Thin  Diet effective now       Question Answer Comment  Room service appropriate? Yes   Fluid consistency: Thin      10/26/23 1348           HPI and Brief Hospital Course:   Briana Pham is a 38 y.o. female with medical history significant for Hodgkin lymphoma diagnosed May 2024 on nontraditional treatment as well as left upper lobe cavitary lesion being admitted to the hospital with leukocytosis.  Patient is essentially asymptomatic, has been on empiric outpatient p.o. and IV antibiotics for the last few months.  Patient was admitted to the hospitalist service for workup of her cavitary lung lesion, she was seen in consultation by pulmonology and ID.  I personally discussed with ID, who recommends no further antibiotics for the time being as the patient is completely asymptomatic.  Also discussed with pulmonology, who plans bronchoscopy in the coming days, both ID and pulmonology agree that outpatient workup would be appropriate.  Patient agrees to this, and will be discharged home in stable condition off of antibiotics.  Discharge details, plan of care and follow up instructions were discussed with patient and any available family or care providers. Patient and family are in agreement with discharge from the hospital today and all questions were answered to  their satisfaction.  Discharge Exam: BP 123/78   Pulse 84   Temp 98.1 F (36.7 C) (Oral)   Resp 16   SpO2 95%  General:  Alert, oriented, calm, in no acute distress  Eyes: EOMI, clear sclerea Neck: supple, no masses, trachea mildline  Cardiovascular: RRR, no murmurs or rubs, no peripheral edema  Respiratory: clear to auscultation bilaterally, no wheezes, no crackles  Abdomen: soft, nontender, nondistended, normal bowel tones heard  Skin: dry, no rashes  Musculoskeletal: no joint effusions, normal range of motion  Psychiatric: appropriate affect, normal speech  Neurologic: extraocular muscles intact, clear speech, moving all extremities with intact sensorium   Discharge Instructions You were cared for by a hospitalist during your hospital stay. If you have any questions about your discharge medications or the care you received while you were in the hospital after you are discharged, you can call the unit and asked to speak with the hospitalist on call if the hospitalist that took care of you is not available. Once you are discharged, your primary care physician will handle any further medical issues. Please note that NO REFILLS for any discharge medications will be authorized once you are discharged, as it is imperative that you return to your primary care physician (or establish a relationship with a primary care physician if you do not have one) for your aftercare needs so that they can reassess your need for medications and monitor your lab values.   Allergies as of 10/26/2023       Reactions  Azithromycin Anaphylaxis, Itching   Rash, crawly skin, could not form sentences Other reaction(s): Confusion   Penicillins Hives, Itching   Confusion Other reaction(s): Confusion ALL CILLIN FAMILY*        Medication List     TAKE these medications    ferrous sulfate  325 (65 FE) MG tablet Take 1 tablet (325 mg total) by mouth daily with breakfast.   ibuprofen  800 MG  tablet Commonly known as: ADVIL  Take 1 tablet (800 mg total) by mouth every 8 (eight) hours as needed.   oxyCODONE  5 MG immediate release tablet Commonly known as: Oxy IR/ROXICODONE  Take 1 tablet (5 mg total) by mouth every 6 (six) hours as needed for severe pain.   PRENATAL COMPLETE PO Take 1 tablet by mouth daily.       Allergies  Allergen Reactions   Azithromycin Anaphylaxis and Itching    Rash, crawly skin, could not form sentences Other reaction(s): Confusion   Penicillins Hives and Itching    Confusion Other reaction(s): Confusion ALL CILLIN FAMILY*     The results of significant diagnostics from this hospitalization (including imaging, microbiology, ancillary and laboratory) are listed below for reference.    Significant Diagnostic Studies: DG Chest 2 View Result Date: 10/26/2023 CLINICAL DATA:  Shortness of breath and cough several months. History of Hodgkin's lymphoma and known left upper lobe cavitary mass. EXAM: CHEST - 2 VIEW COMPARISON:  Chest x-ray 07/27/2023, chest CT 09/02/2023 FINDINGS: Right-sided PICC line with tip over the SVC. Lungs are adequately inflated demonstrate continued evidence of a cavitary process over the left upper lobe without significant change from the recent CT. Stable patchy opacification over the left midlung and right midlung as seen on previous CT. No new airspace process. Small left effusion unchanged to slightly improved. Cardiomediastinal silhouette and remainder of the exam is unchanged. IMPRESSION: 1. Stable cavitary process over the left upper lobe. Stable patchy opacification over the left midlung and right midlung as seen on previous CT. No new airspace process. 2. Small left effusion unchanged to slightly improved. Electronically Signed   By: Toribio Agreste M.D.   On: 10/26/2023 09:49    Microbiology: No results found for this or any previous visit (from the past 240 hours).   Labs: Basic Metabolic Panel: Recent Labs  Lab  10/26/23 1057  NA 139  K 3.9  CL 101  CO2 26  GLUCOSE 136*  BUN 11  CREATININE 0.60  CALCIUM  9.3   Liver Function Tests: No results for input(s): AST, ALT, ALKPHOS, BILITOT, PROT, ALBUMIN in the last 168 hours. No results for input(s): LIPASE, AMYLASE in the last 168 hours. No results for input(s): AMMONIA in the last 168 hours. CBC: Recent Labs  Lab 10/26/23 1057  WBC 17.1*  HGB 9.7*  HCT 34.3*  MCV 84.7  PLT 346   Cardiac Enzymes: No results for input(s): CKTOTAL, CKMB, CKMBINDEX, TROPONINI in the last 168 hours. BNP: BNP (last 3 results) No results for input(s): BNP in the last 8760 hours.  ProBNP (last 3 results) No results for input(s): PROBNP in the last 8760 hours.  CBG: No results for input(s): GLUCAP in the last 168 hours.  Time spent: < 30 minutes were spent in preparing this discharge including medication reconciliation, counseling, and coordination of care.  Signed:  Antar Milks CHRISTELLA Gail, MD  [facility] 10/26/2023, 4:00 PM

## 2023-10-26 NOTE — Consult Note (Signed)
 NAME:  Briana Pham, MRN:  [id number], DOB:  [birth year], LOS: 0 ADMISSION DATE:  10/26/2023 CONSULTATION DATE:  10/26/2023 REFERRING MD:  Zella - TRH, CHIEF COMPLAINT:  Cavitary lesion   History of Present Illness:  38 year old woman who presented to [facility] ED 10/22 for SOB, cough x 4 months. PMHx significant for asthma, insomnia, anxiety, presumed Hodgkin's lymphoma (diagnosed 05/2022, on nontraditional treatment), cavitary lesion of LUL of lung, vocal cord paralysis (unilateral, L). Patient is primarily followed by Dr. Elouise Brooking.  Patient was diagnosed with concern for lymphoma 05/2022. Initially established with Dr. Rudell Bud with [facility]; US  Breast Bx completed with suspicion for involvement by classic Hodgkin's lymphoma. Underwent PET/CT 05/2022 demonstrating hypermetabolic LN above/below the diaphragm consistent with known lymphoma and diffuse FDG uptake in bilateral breast, most on L with hypermetabolic L paraspinal soft tissue thickening ~T4. MRI was recommended; unclear if this was ever obtained as patient was lost to follow up. Patient refused traditional treatment options (radiation/chemotherapy) to pursue nontraditional/holistic options via Dr. Brooking [facility]).   Patient developed a cough in 06/2023 prompting workup. CXR 07/28/2023 demonstrated possible large cavitating lesion of medial LUL c/f cavitary PNA versus necrotic malignancy. CT Chest 08/02/2023 demonstrated large, thick-walled cavitary lesion of medial L apex with numerous additional smaller nodules/opacities (also cavitary) with extensive, confluent matted soft tissue in the anterior mediastinum/L hilum with enlarged LN. PICC line was placed 08/04/2023 for ongoing infusion therapies including Vitamin C. Multiple antibiotic courses have been prescribed and completed with no improvement, to include several PO courses (azithromycin, doxycycline, levofloxacin, linezolid).  Repeat CT scan 09/04/2023  unchanged on my review interpretation.  Patient was sent to ED by PCP 9/15 for IV antibiotic administration for persistent cough, ?PNA. Of note, patient saw Dr. Soldutova 10/6 for chronic hoarseness and was found to have unilateral L-sided vocal cord paralysis. Repeat CT Chest 8/31 grossly unchanged with regard to thick-walled LUL cavitary lesion/bulky LAD. PET/CT 9/10 showed hypermetabolic LAD of neck/chest/abdomen/pelvis c/w lymphoma, hypermetabolic cavitary mass LUL with scattered nodules and hypermetabolic splenic lesions.  Patient ultimately presented to [facility] ED 10/22 for leukocytosis via PCP despite multiple courses of antibiotics. On ED arrival, patient was afebrile with HR 98, BP 145/75, SpO2 100% on RA. Labs were notable for WBC 17.1, Hgb 9.7, Plt 346. Na 139, K 3.9, CO2 26, Cr 0.60. CXR with stable cavitary process of LUL, stable patchy opacification. Admitted by TRH for further evaluation and workup.  PCCM consulted for pulmonary input and evaluation of LUL cavitary lesion.  Pertinent Medical History:   Past Medical History:  Diagnosis Date   Anxiety    Arrest of dilation, delivered, current hospitalization 12/10/2018   Asthma    inhaler used in 2017   Cavitary lesion of lung    LUL   Insomnia    Lymphoma (HCC)    Mental disorder    Pregnancy induced hypertension    Unilateral vocal cord paralysis    Left   Significant Hospital Events: Including procedures, antibiotic start and stop dates in addition to other pertinent events   10/22 - Presented to [facility] ED for cough/SOB x 4 months. CXR with stable cavitary process of LUL, stable patchy opacification. Admitted by TRH for further evaluation and workup. PCCM consulted.  Interim History / Subjective:  Pulmonary consulted for evaluation of LUL cavitary lesion.  Objective:  Blood pressure 123/78, pulse 84, temperature 98.1 F (36.7 C), temperature source Oral, resp. rate 16, SpO2 95%, not currently breastfeeding.  No  intake or output data in the 24 hours ending 10/26/23 1522 There were no vitals filed for this visit.  Physical Examination: General: Well appearing sitting up in chair HEENT: [place]/AT, anicteric sclera, PERRL, moist mucous membranes. Neuro: Awake, oriented x 3.  No focal deficits   CV: RRR, no m/g/r. PULM: Breathing even and unlabored on room air. Lung fields bronchial breath sounds on the left, clear, normal on the right. GI: Nondistended Skin: Warm/dry, no lesions or rash noted.  Resolved Hospital Problem List:    Assessment & Plan:   Cavitary lesion of left upper lobe of lung Hodgkin's lymphoma Bulky lymphadenopathy 2/2 lymphoma Chronic hoarseness 2/2 vocal cord paralysis, unilateral Lung nodules Cavitary upper lung lesion is the primary concern.  Overall stable on serial chest x-rays and CT scans, PET scan.  No risk factors for TB.  No B symptoms.  Minimal cough.  TB felt unlikely.  I suspect this is sequela of prior infection or pneumonia.  With subsequent development of bronchiectasis and cavitary lesion.  Thickened cavitary lesion raises high suspicion for mycetoma.  Likely developed in the setting of her immunosuppression, relative immunosuppression in setting of presumed Hodgkin's lymphoma.  Fortunately she is well-appearing.  Minimal cough.  A bit worse over the last 2 weeks but not ill-appearing.  Has completed multiple rounds of antibiotics with intermittent improvement.  I do not think current presentation is consistent with a bacterial infection.  Other scattered lung nodules are mildly PET avid, high suspicion for foci of lymphoma. -- Appreciate ID assistance -- Follow-up QuantiFERON gold, toxoplasma, cryptococcal tests -- Cannot cough up sputum -- Discussed role and rationale for bronchoscopy, its risks and benefits, in terms of BAL and trying to tear with Aspergillus is present, she expressed understanding and agrees to move forward with bronchoscopy and BAL -- Briefly  discussed difficulty in treating this with antifungals and ultimate often final solution of surgical resection  Discussed case at length with hospitalist.  Discussed with colleague in pulmonary.  Will plan for bronchoscopy and BAL as an outpatient specifically looking for fungal and Aspergillus elements 10/28/2023 at [place] long.  If this cannot be accommodated we will work to get that scheduled at [facility] and performed by colleagues there.  This was discussed in detail with the patient and she expressed understanding and agreement with plan.  Labs:  CBC: Recent Labs  Lab 10/26/23 1057  WBC 17.1*  HGB 9.7*  HCT 34.3*  MCV 84.7  PLT 346   Basic Metabolic Panel: Recent Labs  Lab 10/26/23 1057  NA 139  K 3.9  CL 101  CO2 26  GLUCOSE 136*  BUN 11  CREATININE 0.60  CALCIUM  9.3   GFR: CrCl cannot be calculated (Unknown ideal weight.). Recent Labs  Lab 10/26/23 1057  WBC 17.1*   Liver Function Tests: No results for input(s): AST, ALT, ALKPHOS, BILITOT, PROT, ALBUMIN in the last 168 hours. No results for input(s): LIPASE, AMYLASE in the last 168 hours. No results for input(s): AMMONIA in the last 168 hours.  ABG: No results found for: PHART, PCO2ART, PO2ART, HCO3, TCO2, ACIDBASEDEF, O2SAT   Coagulation Profile: No results for input(s): INR, PROTIME in the last 168 hours.  Cardiac Enzymes: No results for input(s): CKTOTAL, CKMB, CKMBINDEX, TROPONINI in the last 168 hours.  HbA1C: No results found for: HGBA1C  CBG: No results for input(s): GLUCAP in the last 168 hours.  Review of Systems:   No chest pain.  No orthopnea or PND.  Comprehensive review of systems otherwise negative unless mentioned in HPI above.  Past Medical History:  She,  has a past medical history of Anxiety, Arrest of dilation, delivered, current hospitalization (12/10/2018), Asthma, Cavitary lesion of lung, Insomnia, Lymphoma (HCC), Mental disorder,  Pregnancy induced hypertension, and Unilateral vocal cord paralysis.   Surgical History:   Past Surgical History:  Procedure Laterality Date   CESAREAN SECTION N/A 12/10/2018   Procedure: CESAREAN SECTION;  Surgeon: Delana Ted Morrison, DO;  Location: [facility] LD [facility]S;  Service: Obstetrics;  Laterality: N/A;   CESAREAN SECTION N/A 01/01/2022   Procedure: CESAREAN SECTION;  Surgeon: Delana Ted Morrison, DO;  Location: [facility] LD ORS;  Service: Obstetrics;  Laterality: N/A;  request RNFA or Fellow   HYSTEROSCOPY     IR PATIENT EVAL TECH 0-60 MINS  08/09/2023   IR PATIENT EVAL TECH 0-60 MINS  08/17/2023   ivf     MOUTH SURGERY     WISDOM TOOTH EXTRACTION      Social History:   reports that she has quit smoking. Her smoking use included cigarettes. She has never used smokeless tobacco. She reports that she does not drink alcohol and does not use drugs.   Family History:  Her family history includes Heart disease in her father; Multiple sclerosis in her mother; Stroke in her father.   Allergies: Allergies  Allergen Reactions   Azithromycin Anaphylaxis and Itching    Rash, crawly skin, could not form sentences Other reaction(s): Confusion   Penicillins Hives and Itching    Confusion Other reaction(s): Confusion ALL CILLIN FAMILY*   Home Medications: Prior to Admission medications   Medication Sig Start Date End Date Taking? Authorizing Provider  ferrous sulfate  325 (65 FE) MG tablet Take 1 tablet (325 mg total) by mouth daily with breakfast. 01/03/22 10/10/23  Shivaji, Lavonia HERO, MD  ibuprofen  (ADVIL ) 800 MG tablet Take 1 tablet (800 mg total) by mouth every 8 (eight) hours as needed. 01/03/22   Shivaji, Lavonia HERO, MD  oxyCODONE  (OXY IR/ROXICODONE ) 5 MG immediate release tablet Take 1 tablet (5 mg total) by mouth every 6 (six) hours as needed for severe pain. 01/03/22   Shivaji, Lavonia HERO, MD  Prenat MV-Min w/Fe-Folate-DHA (PRENATAL COMPLETE PO) Take 1 tablet by mouth daily.    [provider]   Signature:    Briana JONELLE Beals, MD [facility] 10/26/23 3:22 PM  Please see [email or web address] for contact info  From 7A-7P if no response, please call [phone] After hours, please call ELink [phone]

## 2023-10-26 NOTE — Consult Note (Signed)
 NAME: Briana Pham  DOB: 29-Jul-1985  MRN: [id number]  Date/Time: 10/26/2023 8:11 PM  REQUESTING PROVIDER: Dr. Zella Subjective:  REASON FOR CONSULT: Lung cavitary lesion Patient is in the ED with her wife Briana Pham is a 38 y.o. female with a history of Hodgkin's lymphoma but receiving alternate medical care from her integrative physician Dr. 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 She delivered her child in December 2023.  In early 2024 she noted f enlarged left axillary  lymph nodes and had mammogram and left axillary node core biopsy.on 05/13/2022 and the pathology came back as classic Hodgkin's lymphoma.  She saw an oncologist on 05/19/2022 and she ordered a PET scan which was done on 06/01/2022 that revealed hypermetabolic lymph nodes above and below the diaphragm consistent with known lymphoma, there was diffuse increased FDG uptake in bilateral breast with activity most intense in the lateral aspect of the left breast.  And there was hypermetabolic left paraspinal soft tissue thickening at the level of T4 vertebra.  Patient chose not to have any chemotherapy and decided to go to the integrative medicine route.  In June 2025 patient states she developed a bad case of pneumonia and was given multiple courses of antibiotics initially with  azithromycin and then Levaquin.  She also had left vocal cord paralysis and she thinks it was due to the pneumonia.   Because she was not responding to oral antibiotics on 08/04/2023 she underwent PICC line placement.  She has also been receiving IV vitamin C infusion through the PICC line.  Patient was sent to the ED on 09/19/2023 because she was having persistent pneumonia.She got IV vanco and Iv cefepime  and sent home to the care of her integrative physician She has been getting IV vanco thru PICC now Also getting  injection mistletoe to the cervical and supraclavicular lymph node and says it is smaller now. She also inhales Hellaborus [place] as prescribed by Dr.Bethea Patient states she does not have any fever or chills No weight loss Minimal cough Not productive No shortness of breath Vitals in the ED  10/26/23 09:15  BP 145/75 !  Temp 97.9 F (36.6 C)  Pulse Rate 98  Resp 18  SpO2 100 %     Latest Reference Range & Units 10/26/23 10:57  WBC 4.0 - 10.5 K/uL 17.1 (H)  Hemoglobin 12.0 - 15.0 g/dL 9.7 (L)  HCT 63.9 - 53.9 % 34.3 (L)  Platelets 150 - 400 K/uL 346  Creatinine 0.44 - 1.00 mg/dL 9.39   Blood culture was sent. CXR showed stable cavitary process over the left upper lobe.  Stable patchy opacification over the left midlung and right midlung as seen on the previous CAT scans No new airspace process.  Past Surgical History:  Procedure Laterality Date   CESAREAN SECTION N/A 12/10/2018   Procedure: CESAREAN SECTION;  Surgeon: Delana Ted Morrison, DO;  Location: [facility]S;  Service: Obstetrics;  Laterality: N/A;   CESAREAN SECTION N/A 01/01/2022   Procedure: CESAREAN SECTION;  Surgeon: Delana Ted Morrison, DO;  Location: [facility] LD ORS;  Service: Obstetrics;  Laterality: N/A;  request RNFA or Fellow   HYSTEROSCOPY     IR PATIENT EVAL TECH 0-60 MINS  08/09/2023   IR PATIENT EVAL TECH 0-60 MINS  08/17/2023   ivf     MOUTH SURGERY  WISDOM TOOTH EXTRACTION      Social History   Socioeconomic History   Marital status: Married    Spouse name: Anwitha Mapes   Number of children: 0   Years of education: 12+   Highest education level: Not on file  Occupational History   Occupation: POLICE DEPARTMENT    Employer: [employer]  Tobacco Use   Smoking status: Former    Types: Cigarettes   Smokeless tobacco: Never  Vaping Use   Vaping status: Never Used  Substance and Sexual Activity   Alcohol use: No   Drug use: No   Sexual activity: Not Currently  Other Topics  Concern   Not on file  Social History Narrative   Patient lives at home Alone.    Patient has no children.    Patient is right handed.    Patient has a BA in science.    Patient is single.       Social Drivers of Corporate investment banker Strain: Low Risk  (05/25/2022)   Received from [facility]   Overall Financial Resource Strain (CARDIA)    Difficulty of Paying Living Expenses: Not hard at all  Food Insecurity: No Food Insecurity (05/25/2022)   Received [facility]   Hunger Vital Sign    Within the past 12 months, you worried that your food would run out before you got the money to buy more.: Never true    Within the past 12 months, the food you bought just didn't last and you didn't have money to get more.: Never true  Transportation Needs: No Transportation Needs (05/25/2022)   Received from [facility]    Lack of Transportation (Medical): No    Lack of Transportation (Non-Medical): No  Physical Activity: Not on file  Stress: Not on file  Social Connections: Unknown (05/07/2022)   Received from [facility]   Social Network    Social Network: Not on file  Intimate Partner Violence: Unknown (05/07/2022)   Received from [facility]   HITS    Physically Hurt: Not on file    Insult or Talk Down To: Not on file    Threaten Physical Harm: Not on file    Scream or Curse: Not on file    Family History  Problem Relation Age of Onset   Multiple sclerosis Mother    Heart disease Father    Stroke Father    Allergies  Allergen Reactions   Azithromycin Anaphylaxis and Itching    Rash, crawly skin, could not form sentences Other reaction(s): Confusion   Penicillins Hives and Itching    Confusion Other reaction(s): Confusion ALL CILLIN FAMILY*   I? Current Facility-Administered Medications  Medication Dose Route Frequency Provider Last Rate Last Admin   acetaminophen  (TYLENOL ) tablet 650 mg  650 mg Oral Q6H PRN Zella, Mir M, MD        Or   acetaminophen  (TYLENOL ) suppository 650 mg  650 mg Rectal Q6H PRN Zella, Mir M, MD       albuterol (PROVENTIL) (2.5 MG/3ML) 0.083% nebulizer solution 2.5 mg  2.5 mg Nebulization Q2H PRN Zella, Mir M, MD       ondansetron  (ZOFRAN ) tablet 4 mg  4 mg Oral Q6H PRN Zella, Mir M, MD       Or   ondansetron  (ZOFRAN ) injection 4 mg  4 mg Intravenous Q6H PRN Zella, Mir M, MD       traZODone (DESYREL) tablet 25 mg  25 mg Oral QHS PRN Zella Katha HERO, MD       Current Outpatient Medications  Medication Sig Dispense Refill   ferrous sulfate  325 (65 FE) MG tablet Take 1 tablet (325 mg total) by mouth daily with breakfast. 30 tablet 0   ibuprofen  (ADVIL ) 800 MG tablet Take 1 tablet (800 mg total) by mouth every 8 (eight) hours as needed. 60 tablet 1   oxyCODONE  (OXY IR/ROXICODONE ) 5 MG immediate release tablet Take 1 tablet (5 mg total) by mouth every 6 (six) hours as needed for severe pain. 28 tablet 0   Prenat MV-Min w/Fe-Folate-DHA (PRENATAL COMPLETE PO) Take 1 tablet by mouth daily.       Abtx:  Anti-infectives (From admission, onward)    None       REVIEW OF SYSTEMS:  Const: negative fever, negative chills, negative weight loss Eyes: negative diplopia or visual changes, negative eye pain ENT: negative coryza, negative sore throat Resp: As above Cards: negative for chest pain, palpitations, lower extremity edema GU: negative for frequency, dysuria and hematuria GI: Negative for abdominal pain, diarrhea, bleeding, constipation Skin: negative for rash and pruritus Heme: negative for easy bruising and gum/nose bleeding MS: negative for myalgias, arthralgias, back pain and muscle weakness Neurolo:negative for headaches, dizziness, vertigo, memory problems  Psych: negative for feelings of anxiety, depression  Endocrine: negative for thyroid, diabetes Allergy/Immunology- negative for any medication or food allergies ?  Objective:  VITALS:  BP 123/78   Pulse  84   Temp 98.1 F (36.7 C) (Oral)   Resp 16   SpO2 95%  LDA Right PICC line PHYSICAL EXAM:  General: Alert, cooperative, no distress, appears stated age.  Head: Normocephalic, without obvious abnormality, atraumatic. Eyes: Conjunctivae clear, anicteric sclerae. Pupils are equal ENT Nares normal. No drainage or sinus tenderness. Lips, mucosa, and tongue normal. No Thrush Neck: Cervical lymphadenopathy Axillary lymphadenopathy and inguinal lymphadenopathy Back: No CVA tenderness. Lungs: Bilateral air entry. Heart: Regular rate and rhythm, no murmur, rub or gallop. Abdomen: Soft, non-tender,not distended. Bowel sounds normal. No masses Extremities: atraumatic, no cyanosis. No edema. No clubbing Skin: No rashes or lesions. Or bruising Lymph: Cervical, supraclavicular normal. Neurologic: Grossly non-focal Pertinent Labs Lab Results CBC    Component Value Date/Time   WBC 17.1 (H) 10/26/2023 1057   RBC 4.05 10/26/2023 1057   HGB 9.7 (L) 10/26/2023 1057   HCT 34.3 (L) 10/26/2023 1057   PLT 346 10/26/2023 1057   MCV 84.7 10/26/2023 1057   MCH 24.0 (L) 10/26/2023 1057   MCHC 28.3 (L) 10/26/2023 1057   RDW 21.2 (H) 10/26/2023 1057   LYMPHSABS 1.1 09/19/2023 1237   MONOABS 2.2 (H) 09/19/2023 1237   EOSABS 0.1 09/19/2023 1237   BASOSABS 0.0 09/19/2023 1237       Latest Ref Rng & Units 10/26/2023   10:57 AM 09/19/2023   12:37 PM 07/07/2021    1:55 PM  CMP  Glucose 70 - 99 mg/dL 863  896  90   BUN 6 - 20 mg/dL 11  10  8    Creatinine 0.44 - 1.00 mg/dL 9.39  9.34  9.30   Sodium 135 - 145 mmol/L 139  134  136   Potassium 3.5 - 5.1 mmol/L 3.9  4.0  3.9   Chloride 98 - 111 mmol/L 101  98  105   CO2 22 - 32 mmol/L 26  22  21    Calcium  8.9 - 10.3 mg/dL 9.3  9.6  9.1   Total Protein 6.5 -  8.1 g/dL  7.3  7.3   Total Bilirubin 0.0 - 1.2 mg/dL  0.5  0.3   Alkaline Phos 38 - 126 U/L  190  93   AST 15 - 41 U/L  18  14   ALT 0 - 44 U/L  21  11       Microbiology: No results found  for this or any previous visit (from the past 240 hours).  Lines and Device Date on insertion # of days DC  Central line     Foley     ETT       IMAGING RESULTS: 06/07/23 Extensive consolidating pulmonary infiltrates of the left lung   07/07/23 possible cavitary lesion noted in the left upper lobe concerning for cavitating pneumonia possibly necrotic malignancy.   10/26/23 stable cavitary process over the left upper lobe.  And patchy opacification over the left midlung and right midlung I have personally reviewed the films  CT chest done in July 2025 shows large thick-walled cavitary lesion of the medial left apex with a large interface to the pleura and confluent soft tissue in the adjacent mediastinum measuring 7.3 into 5.8 cm.  Extensive confluent matted soft tissue throughout the anterior mediastinum, prevascular space and left hilum with additional enlarged pretracheal and right hilar lymph nodes Fairly bulky enlarged bilateral axillary and subpectoral lymph nodes.    Repeat CT chest done on 09/02/2023 was pretty much the same. A PET scan done on 09/14/2023 showed hypermetabolic adenopathy in the neck, chest, abdomen and pelvis compatible with lymphoma Hypermetabolic cavitary mass in the left upper lobe with additional scattered pulmonary nodules of variable metabolic activity Hypermetabolic splenic lesions Mild cardiomegaly Small to moderate left pleural effusion Chronic bilateral maxillary sinusitis.   Impression/Recommendation Hodgkin's lymphoma not on any chemotherapy Left upper lobe cavitary lesion present since July 2025 late 2 6 pneumonia with consolidation on the left lung in June 2025 Patient has been on multiple courses of antibiotics given by her integrative physician She had taken azithromycin, doxycycline, Levaquin, linezolid and even IV antibiotics vancomycin  She has not had any diagnostic procedure like bronchoscopy done to identify the reason for the pneumonia or  the cavitary lesions in the lung  Leukocytosis this has been present for many months now.  I spoke to her physician Dr. Cole who said once it had normalized but 1 L was started increasing again.  As patient is very stable when he defined the cause of her cavitary lesion.  Need to rule out atypical mycobacterial or fungal infections or other noninfectious causes. She will need bronchoscopy with cultures sent for fungus, AFB and bacteria and also biopsy for pathology and cytology.  Patient is waiting to see pulmonologist She would prefer to have this procedure done as outpatient she says instead of getting admitted  I will send a few blood test to look for opportunistic infection including cryptococcal titer, toxo titer, fungal antibodies, QuantiFERON gold, HIV  Discussed the management in great detail with the patient and her wife . Discussed with her PCP Dr. Cole who will need to manage her. Discussed with the ED physician and hospitalist This consult involve complex infectious pathology management  _I have personally spent  60---minutes involved in face-to-face and non-face-to-face activities for this patient on the day of the visit. Professional time spent includes the following activities: Preparing to see the patient (review of tests), Obtaining and/or reviewing separately obtained history (admission/discharge record), Performing a medically appropriate examination and/or evaluation , Ordering medications/tests/procedures, referring and  communicating with other health care professionals, Documenting clinical information in the EMR, Independently interpreting results (not separately reported), Communicating results to the patient/partner, Counseling and educating the patient/partner and Care coordination (not separately reported).    ________________________________________________  Note:  This document was prepared using Conservation officer, historic buildings and may include unintentional  dictation errors.

## 2023-10-26 NOTE — ED Triage Notes (Signed)
 Pt reports with shob and coughing up mucus since June. Pt reports having pna since June and reports abts are not working.

## 2023-10-26 NOTE — H&P (Signed)
 History and Physical  Briana Pham FMW:[id number] DOB: 03/21/1985 DOA: 10/26/2023  PCP: Briana Elouise HERO, MD   Chief Complaint: Cavitary lesion  HPI: Briana Pham is a 38 y.o. female with medical history significant for Hodgkin lymphoma diagnosed May 2024 on nontraditional treatment as well as left upper lobe cavitary lesion being admitted to the hospital with leukocytosis.  History is provided by the patient, as well as my review of her medical record.  Appears that she had some adenopathy noticed in the summer 2024 and was diagnosed with Hodgkin lymphoma.  She was seen in consultation by Dr. Rudell Bud at [facility].  She was offered further workup including radiation, but she declined.  She decided to see a physician that provides natural treatments, and has been receiving different infusions via PICC line for the last several months.  At some point, she was discovered to have a cavitary lesion in her left side of her chest.  This is essentially asymptomatic, she denies any fevers, chills, nausea, vomiting, shortness of breath, but states only that she can feel a little something in her chest on the left side.  In the interim, she has also developed some hoarseness, she recently saw ENT who felt that this might be a result of some adenopathy in her neck.  Patient has continued to be essentially symptomatic, however she has continued leukocytosis.  It appears that her provider Dr. Cole started her on IV cefepime  and IV vancomycin  within the last 2 weeks.  She sent her to the ER for evaluation due to continued leukocytosis.  Review of Systems: Please see HPI for pertinent positives and negatives. A complete 10 system review of systems are otherwise negative.  Past Medical History:  Diagnosis Date   Anxiety    Arrest of dilation, delivered, current hospitalization 12/10/2018   Asthma    inhaler used in 2017   Insomnia    Mental disorder    Pregnancy induced hypertension     Past Surgical History:  Procedure Laterality Date   CESAREAN SECTION N/A 12/10/2018   Procedure: CESAREAN SECTION;  Surgeon: Delana Ted Morrison, DO;  Location: [facility];  Service: Obstetrics;  Laterality: N/A;   CESAREAN SECTION N/A 01/01/2022   Procedure: CESAREAN SECTION;  Surgeon: Delana Ted Morrison, DO;  Location: [facility];  Service: Obstetrics;  Laterality: N/A;  request RNFA or Fellow   HYSTEROSCOPY     IR PATIENT EVAL TECH 0-60 MINS  08/09/2023   IR PATIENT EVAL TECH 0-60 MINS  08/17/2023   ivf     MOUTH SURGERY     WISDOM TOOTH EXTRACTION     Social History:  reports that she has quit smoking. Her smoking use included cigarettes. She has never used smokeless tobacco. She reports that she does not drink alcohol and does not use drugs.  Allergies  Allergen Reactions   Azithromycin Anaphylaxis and Itching    Rash, crawly skin, could not form sentences Other reaction(s): Confusion   Penicillins Hives and Itching    Confusion Other reaction(s): Confusion ALL CILLIN FAMILY*    Family History  Problem Relation Age of Onset   Multiple sclerosis Mother    Heart disease Father    Stroke Father      Prior to Admission medications   Medication Sig Start Date End Date Taking? Authorizing Provider  ferrous sulfate  325 (65 FE) MG tablet Take 1 tablet (325 mg total) by mouth daily with breakfast. 01/03/22 10/10/23  Shivaji, Lavonia HERO, MD  ibuprofen  (ADVIL ) 800 MG tablet Take 1 tablet (800 mg total) by mouth every 8 (eight) hours as needed. 01/03/22   Shivaji, Lavonia HERO, MD  oxyCODONE  (OXY IR/ROXICODONE ) 5 MG immediate release tablet Take 1 tablet (5 mg total) by mouth every 6 (six) hours as needed for severe pain. 01/03/22   Shivaji, Lavonia HERO, MD  Prenat MV-Min w/Fe-Folate-DHA (PRENATAL COMPLETE PO) Take 1 tablet by mouth daily.    [provider]    Physical Exam: BP 123/78   Pulse 84   Temp 98.1 F (36.7 C) (Oral)   Resp 16   SpO2 95%  General:  Alert,  oriented, calm, in no acute distress  Eyes: EOMI, clear conjuctivae, white sclerea Neck: supple, no masses, trachea mildline  Cardiovascular: RRR, no murmurs or rubs, no peripheral edema  Respiratory: clear to auscultation bilaterally, no wheezes, no crackles  Abdomen: soft, nontender, nondistended, normal bowel tones heard  Skin: dry, no rashes  Musculoskeletal: no joint effusions, normal range of motion  Psychiatric: appropriate affect, normal speech  Neurologic: extraocular muscles intact, clear speech, moving all extremities with intact sensorium         Labs on Admission:  Basic Metabolic Panel: Recent Labs  Lab 10/26/23 1057  NA 139  K 3.9  CL 101  CO2 26  GLUCOSE 136*  BUN 11  CREATININE 0.60  CALCIUM  9.3   Liver Function Tests: No results for input(s): AST, ALT, ALKPHOS, BILITOT, PROT, ALBUMIN in the last 168 hours. No results for input(s): LIPASE, AMYLASE in the last 168 hours. No results for input(s): AMMONIA in the last 168 hours. CBC: Recent Labs  Lab 10/26/23 1057  WBC 17.1*  HGB 9.7*  HCT 34.3*  MCV 84.7  PLT 346   Cardiac Enzymes: No results for input(s): CKTOTAL, CKMB, CKMBINDEX, TROPONINI in the last 168 hours. BNP (last 3 results) No results for input(s): BNP in the last 8760 hours.  ProBNP (last 3 results) No results for input(s): PROBNP in the last 8760 hours.  CBG: No results for input(s): GLUCAP in the last 168 hours.  Radiological Exams on Admission: DG Chest 2 View Result Date: 10/26/2023 CLINICAL DATA:  Shortness of breath and cough several months. History of Hodgkin's lymphoma and known left upper lobe cavitary mass. EXAM: CHEST - 2 VIEW COMPARISON:  Chest x-ray 07/27/2023, chest CT 09/02/2023 FINDINGS: Right-sided PICC line with tip over the SVC. Lungs are adequately inflated demonstrate continued evidence of a cavitary process over the left upper lobe without significant change from the recent CT. Stable  patchy opacification over the left midlung and right midlung as seen on previous CT. No new airspace process. Small left effusion unchanged to slightly improved. Cardiomediastinal silhouette and remainder of the exam is unchanged. IMPRESSION: 1. Stable cavitary process over the left upper lobe. Stable patchy opacification over the left midlung and right midlung as seen on previous CT. No new airspace process. 2. Small left effusion unchanged to slightly improved. Electronically Signed   By: Toribio Agreste M.D.   On: 10/26/2023 09:49   Assessment/Plan Briana Pham is a 38 y.o. female with medical history significant for Hodgkin lymphoma diagnosed May 2024 on nontraditional treatment as well as left upper lobe cavitary lesion being admitted to the hospital with leukocytosis.   Left upper lobe cavitary process-unclear etiology, differential is quite broad.  No particular exposures or risk factors other than her lymphoma.  Since this summer, she has been on empiric p.o. azithromycin, p.o. doxycycline, p.o. Levaquin, p.o.  Zyvox, p.o. vancomycin , and more recently started on IV vancomycin  and IV cefepime  via PICC line.  Patient confirms that no cultures were done. -Inpatient admission -Peripheral blood culture -Sputum culture, though patient has difficulty with expectorating -Discussed with ID who will consult -Discussed with pulmonology who will consult  Lymphoma-currently not treated, as patient has elected for alternative treatments  Leukocytosis-potentially due to her lymphoma, or potential chronic infection  DVT prophylaxis: Lovenox     Code Status: Full Code  Consults called: Pulmonology Dr. Annella, ID Dr. Fayette  Admission status: The appropriate patient status for this patient is INPATIENT. Inpatient status is judged to be reasonable and necessary in order to provide the required intensity of service to ensure the patient's safety. The patient's presenting symptoms, physical exam  findings, and initial radiographic and laboratory data in the context of their chronic comorbidities is felt to place them at high risk for further clinical deterioration. Furthermore, it is not anticipated that the patient will be medically stable for discharge from the hospital within 2 midnights of admission.    I certify that at the point of admission it is my clinical judgment that the patient will require inpatient hospital care spanning beyond 2 midnights from the point of admission due to high intensity of service, high risk for further deterioration and high frequency of surveillance required  Time spent: 59 minutes  Evangelia Whitaker CHRISTELLA Gail MD [facility] Pager [phone]  If 7PM-7AM, please contact night-coverage [email or web address] Password [email or web address]  10/26/2023, 1:54 PM

## 2023-10-26 NOTE — Telephone Encounter (Signed)
 Patient is scheduled for 10/28/23 at 11:30am. Patient is currently at [facility] long hospital. I was informed by [facility] that they will give patient the information for 10/28/23 on 10/27/23. Routing to [employer] for Authorization.

## 2023-10-27 ENCOUNTER — Encounter (HOSPITAL_COMMUNITY): Payer: Self-pay | Admitting: Pulmonary Disease

## 2023-10-27 LAB — BLOOD CULTURE ID PANEL (REFLEXED) - BCID2

## 2023-10-27 LAB — TOXOPLASMA ANTIBODIES- IGG AND  IGM
Toxoplasma Antibody- IgM: <3 [AU]/ml (ref 0.0–7.9)
Toxoplasma IgG Ratio: <3 [IU]/mL (ref 0.0–7.1)

## 2023-10-27 SURGERY — VIDEO BRONCHOSCOPY WITHOUT FLUORO
Anesthesia: General

## 2023-10-28 ENCOUNTER — Other Ambulatory Visit: Payer: Self-pay

## 2023-10-28 ENCOUNTER — Encounter (HOSPITAL_COMMUNITY): Payer: Self-pay | Admitting: Pulmonary Disease

## 2023-10-28 ENCOUNTER — Ambulatory Visit (HOSPITAL_COMMUNITY): Admitting: Anesthesiology

## 2023-10-28 ENCOUNTER — Ambulatory Visit (HOSPITAL_COMMUNITY)
Admission: RE | Admit: 2023-10-28 | Discharge: 2023-10-28 | Disposition: A | Discharge status: Home / Self Care | Attending: Pulmonary Disease | Admitting: Pulmonary Disease

## 2023-10-28 ENCOUNTER — Encounter (HOSPITAL_COMMUNITY)
Admission: RE | Disposition: A | Discharge status: Home / Self Care | Payer: Self-pay | Source: Home / Self Care | Attending: Pulmonary Disease

## 2023-10-28 DIAGNOSIS — J3801 Paralysis of vocal cords and larynx, unilateral: Secondary | ICD-10-CM | POA: Diagnosis not present

## 2023-10-28 DIAGNOSIS — R911 Solitary pulmonary nodule: Secondary | ICD-10-CM | POA: Diagnosis not present

## 2023-10-28 DIAGNOSIS — C8190 Hodgkin lymphoma, unspecified, unspecified site: Secondary | ICD-10-CM | POA: Insufficient documentation

## 2023-10-28 DIAGNOSIS — J984 Other disorders of lung: Secondary | ICD-10-CM

## 2023-10-28 DIAGNOSIS — Z87891 Personal history of nicotine dependence: Secondary | ICD-10-CM | POA: Insufficient documentation

## 2023-10-28 HISTORY — PX: VIDEO BRONCHOSCOPY: SHX5072

## 2023-10-28 LAB — BODY FLUID CELL COUNT WITH DIFFERENTIAL
Eos, Fluid: 20 %
Lymphs, Fluid: 0 %
Monocyte-Macrophage-Serous Fluid: 64 % (ref 50–90)
Neutrophil Count, Fluid: 16 % (ref 0–25)
Total Nucleated Cell Count, Fluid: 85 uL (ref 0–1000)

## 2023-10-28 SURGERY — VIDEO BRONCHOSCOPY WITHOUT FLUORO
Anesthesia: General

## 2023-10-28 MED ORDER — MIDAZOLAM HCL 5 MG/5ML IJ SOLN
INTRAMUSCULAR | Status: DC | PRN
Start: 2023-10-28 — End: 2023-10-28
  Administered 2023-10-28: 2 mg via INTRAVENOUS

## 2023-10-28 MED ORDER — ONDANSETRON HCL 4 MG/2ML IJ SOLN
INTRAMUSCULAR | Status: DC | PRN
  Administered 2023-10-28: 4 mg via INTRAVENOUS

## 2023-10-28 MED ORDER — MIDAZOLAM HCL 2 MG/2ML IJ SOLN
INTRAMUSCULAR | Status: AC
  Filled 2023-10-28: qty 2

## 2023-10-28 MED ORDER — PROPOFOL 10 MG/ML IV BOLUS
INTRAVENOUS | Status: DC | PRN
  Administered 2023-10-28: 100 ug/kg/min via INTRAVENOUS
  Administered 2023-10-28: 150 mg via INTRAVENOUS
  Administered 2023-10-28: 50 mg via INTRAVENOUS

## 2023-10-28 MED ORDER — FENTANYL CITRATE (PF) 100 MCG/2ML IJ SOLN
INTRAMUSCULAR | Status: DC | PRN
  Administered 2023-10-28: 100 ug via INTRAVENOUS

## 2023-10-28 MED ORDER — ACETAMINOPHEN 10 MG/ML IV SOLN: 1000.0000 mg | Freq: Once | INTRAVENOUS | Status: DC | PRN

## 2023-10-28 MED ORDER — FENTANYL CITRATE (PF) 100 MCG/2ML IJ SOLN: 25.0000 ug | INTRAMUSCULAR | Status: DC | PRN

## 2023-10-28 MED ORDER — SUCCINYLCHOLINE CHLORIDE 200 MG/10ML IV SOSY
PREFILLED_SYRINGE | INTRAVENOUS | Status: DC | PRN
  Administered 2023-10-28: 120 mg via INTRAVENOUS

## 2023-10-28 MED ORDER — SODIUM CHLORIDE 0.9 % IV SOLN
INTRAVENOUS | Status: AC | PRN
  Administered 2023-10-28: 500 mL via INTRAMUSCULAR

## 2023-10-28 MED ORDER — PHENYLEPHRINE 80 MCG/ML (10ML) SYRINGE FOR IV PUSH (FOR BLOOD PRESSURE SUPPORT)
PREFILLED_SYRINGE | INTRAVENOUS | Status: DC | PRN
  Administered 2023-10-28: 80 ug via INTRAVENOUS

## 2023-10-28 MED ORDER — DEXAMETHASONE SODIUM PHOSPHATE 4 MG/ML IJ SOLN
INTRAMUSCULAR | Status: DC | PRN
  Administered 2023-10-28: 10 mg via INTRAVENOUS

## 2023-10-28 MED ORDER — FENTANYL CITRATE (PF) 100 MCG/2ML IJ SOLN
INTRAMUSCULAR | Status: AC
  Filled 2023-10-28: qty 2

## 2023-10-28 MED ORDER — DROPERIDOL 2.5 MG/ML IJ SOLN: 0.6250 mg | Freq: Once | INTRAMUSCULAR | Status: DC | PRN

## 2023-10-28 MED ORDER — LIDOCAINE HCL (CARDIAC) PF 100 MG/5ML IV SOSY
PREFILLED_SYRINGE | INTRAVENOUS | Status: DC | PRN
  Administered 2023-10-28: 60 mg via INTRATRACHEAL

## 2023-10-28 MED ORDER — HEPARIN SOD (PORK) LOCK FLUSH 100 UNIT/ML IV SOLN
250.0000 [IU] | INTRAVENOUS | Status: AC | PRN
  Administered 2023-10-28 (×2): 250 [IU]

## 2023-10-28 NOTE — Transfer of Care (Signed)
 Immediate Anesthesia Transfer of Care Note  [name]  Procedure(s) Performed: VIDEO BRONCHOSCOPY WITHOUT FLUORO  Patient Location: PACU  Anesthesia Type:General  Level of Consciousness: drowsy and patient cooperative  Airway & Oxygen Therapy: Patient Spontanous Breathing  Post-op Assessment: Report given to RN  Post vital signs: Reviewed and stable  Last Vitals:  Vitals Value Taken Time  BP 99/65 10/28/23 12:08  Temp    Pulse 119 10/28/23 12:10  Resp 28 10/28/23 12:10  SpO2 92 % 10/28/23 12:10  Vitals shown include unfiled device data.  Last Pain:  Vitals:   10/28/23 1056  TempSrc: Temporal  PainSc: 0-No pain         Complications: No notable events documented.

## 2023-10-28 NOTE — Interval H&P Note (Signed)
 History and Physical Interval Note:  10/28/2023 10:41 AM  Briana Pham  has presented today for surgery, with the diagnosis of caitary Lung Lesion.  The various methods of treatment have been discussed with the patient and family. After consideration of risks, benefits and other options for treatment, the patient has consented to  Procedure(s): VIDEO BRONCHOSCOPY WITHOUT FLUORO (N/A) as a surgical intervention.  The patient's history has been reviewed, patient examined, no change in status, stable for surgery.  I have reviewed the patient's chart and labs.  Questions were answered to the patient's satisfaction.     Donnice SAUNDERS Davi Rotan

## 2023-10-28 NOTE — Anesthesia Preprocedure Evaluation (Signed)
 Anesthesia Evaluation  Patient identified by MRN, date of birth, ID band Patient awake    Reviewed: Allergy & Precautions, NPO status , Patient's Chart, lab work & pertinent test results  Airway Mallampati: II  TM Distance: >3 FB Neck ROM: Full    Dental no notable dental hx.    Pulmonary asthma , former smoker Cavitary lung lesion   Pulmonary exam normal        Cardiovascular hypertension,  Rhythm:Regular Rate:Normal     Neuro/Psych   Anxiety     negative neurological ROS     GI/Hepatic negative GI ROS, Neg liver ROS,,,  Endo/Other  negative endocrine ROS    Renal/GU negative Renal ROS  negative genitourinary   Musculoskeletal negative musculoskeletal ROS (+)    Abdominal Normal abdominal exam  (+)   Peds  Hematology Lab Results      Component                Value               Date                      WBC                      17.1 (H)            10/26/2023                HGB                      9.7 (L)             10/26/2023                HCT                      34.3 (L)            10/26/2023                MCV                      84.7                10/26/2023                PLT                      346                 10/26/2023             Lab Results      Component                Value               Date                      NA                       139                 10/26/2023                K  3.9                 10/26/2023                CO2                      26                  10/26/2023                GLUCOSE                  136 (H)             10/26/2023                BUN                      11                  10/26/2023                CREATININE               0.60                10/26/2023                CALCIUM                   9.3                 10/26/2023                GFRNONAA                 >60                 10/26/2023              Anesthesia Other  Findings   Reproductive/Obstetrics                              Anesthesia Physical Anesthesia Plan  ASA: 2  Anesthesia Plan: General   Post-op Pain Management:    Induction: Intravenous  PONV Risk Score and Plan: 3 and Ondansetron , Dexamethasone , Midazolam and Treatment may vary due to age or medical condition  Airway Management Planned: Mask and Oral ETT  Additional Equipment: None  Intra-op Plan:   Post-operative Plan: Extubation in OR  Informed Consent: I have reviewed the patients History and Physical, chart, labs and discussed the procedure including the risks, benefits and alternatives for the proposed anesthesia with the patient or authorized representative who has indicated his/her understanding and acceptance.     Dental advisory given  Plan Discussed with: CRNA  Anesthesia Plan Comments:         Anesthesia Quick Evaluation

## 2023-10-28 NOTE — Anesthesia Postprocedure Evaluation (Signed)
 Anesthesia Post Note  Patient: Ticia K Roemer  Procedure(s) Performed: VIDEO BRONCHOSCOPY WITHOUT FLUORO     Patient location during evaluation: PACU Anesthesia Type: General Level of consciousness: awake and alert Pain management: pain level controlled Vital Signs Assessment: post-procedure vital signs reviewed and stable Respiratory status: spontaneous breathing, nonlabored ventilation, respiratory function stable and patient connected to nasal cannula oxygen Cardiovascular status: blood pressure returned to baseline and stable Postop Assessment: no apparent nausea or vomiting Anesthetic complications: no   No notable events documented.  Last Vitals:  Vitals:   10/28/23 1230 10/28/23 1240  BP: (!) 109/49 125/62  Pulse: (!) 112 (!) 110  Resp: (!) 25 (!) 25  Temp:    SpO2: 97% 96%    Last Pain:  Vitals:   10/28/23 1221  TempSrc:   PainSc: 0-No pain                 Cordella P Tyne Banta

## 2023-10-28 NOTE — Op Note (Signed)
 Bronchoscopy Procedure Note  Briana Pham  [id number]  13-Jun-1985  Date:10/28/23  Time:11:54 AM   Provider Performing:Demian Maisel R Carnisha Feltz   Procedure(s):  Flexible Bronchoscopy [phone]), Flexible bronchoscopy with bronchial alveolar lavage (68375), and Initial Therapeutic Aspiration of Tracheobronchial Tree (68354)  Indication(s) Cavitary lung lesion  Consent Risks of the procedure as well as the alternatives and risks of each were explained to the patient and/or caregiver.  Consent for the procedure was obtained and is signed in the bedside chart  Anesthesia General see dedicated anesthesia documentation    Time Out Verified patient identification, verified procedure, site/side was marked, verified correct patient position, special equipment/implants available, medications/allergies/relevant history reviewed, required imaging and test results available.   Sterile Technique Usual hand hygiene, masks, gowns, and gloves were used   Procedure Description Bronchoscope advanced through endotracheal tube and into airway.  Airways were examined down to subsegmental level with findings noted below.   Following diagnostic evaluation, BAL(s) performed in most apical sub segment of left upper lobe apicoposterior segment  with 100 cc normal saline and return of 30 cc of cloudy fluid.  Findings:  Normal bronchial anatomy on the right and left Subsegment of apical posterior left upper lobe with edema, decreased diameter of orifice, purulent mucus emanating from orifice BAL performed at abnormality   Complications/Tolerance None; patient tolerated the procedure well. Chest X-ray is not needed post procedure.   EBL none   Specimen(s) Cell count, cytology, bacterial, fungal, AFB culture, Aspergillus galactomannan, pneumocystis DFA

## 2023-10-28 NOTE — ED Notes (Signed)
 10/28/2023 7:05 am  Dr Theadore spoke with patient about positive blood culture result, states she is feeling better and has a follow up appointment scheduled. Advised to return to ED if patients symptoms worsen or has further concerns.

## 2023-10-28 NOTE — ED Provider Notes (Signed)
 I was asked to review patient's case given recent blood culture results.  Coagulase-negative staph growing in 1 out of 4 bottles.  I called the patient and she says she is feeling well, no symptoms, no fever.  Had a recent admission for leukocytosis, she has a known lung infection and has a port which raises the clinical concern for bacteremia.  MR coagulase-negative staph is associated with catheter related infections however she is not growing this bacteria out of her catheter, it is only 1 out of 4 drawn peripherally.  Given that less than 2 bottles are positive and patient is clinically well, this would be most suggestive of contaminant.  Still the concern was discussed with the patient and she agrees with strict return precautions to the emergency department with any worsening of condition or fever.   Theadore Ozell HERO, MD 10/28/23 [phone]

## 2023-10-28 NOTE — Anesthesia Procedure Notes (Addendum)
 Procedure Name: Intubation Date/Time: 10/28/2023 11:38 AM  Performed by: Delores Duwaine SAUNDERS, CRNAPre-anesthesia Checklist: Patient identified, Emergency Drugs available, Suction available and Patient being monitored Patient Re-evaluated:Patient Re-evaluated prior to induction Oxygen Delivery Method: Circle System Utilized Preoxygenation: Pre-oxygenation with 100% oxygen Induction Type: IV induction Ventilation: Mask ventilation without difficulty Laryngoscope Size: Mac and 3 Grade View: Grade II Tube type: Oral Tube size: 8.0 mm Number of attempts: 2 (intubated by med student; first pass flipped into esophagus; second pass successful) Airway Equipment and Method: Stylet and Oral airway Placement Confirmation: ETT inserted through vocal cords under direct vision, positive ETCO2 and breath sounds checked- equal and bilateral Secured at: 22 cm Tube secured with: Tape Dental Injury: Teeth and Oropharynx as per pre-operative assessment

## 2023-10-29 LAB — QUANTIFERON-TB GOLD PLUS: QuantiFERON-TB Gold Plus: UNDETERMINED — AB

## 2023-10-29 LAB — QUANTIFERON-TB GOLD PLUS (RQFGPL)
QuantiFERON Mitogen Value: 0.01 [IU]/mL
QuantiFERON Nil Value: 0.01 [IU]/mL
QuantiFERON TB1 Ag Value: 0.01 [IU]/mL
QuantiFERON TB2 Ag Value: 0.01 [IU]/mL

## 2023-10-30 LAB — ACID FAST SMEAR (AFB, MYCOBACTERIA): Acid Fast Smear: NEGATIVE

## 2023-10-30 LAB — CULTURE, BLOOD (ROUTINE X 2)

## 2023-10-31 ENCOUNTER — Telehealth (HOSPITAL_BASED_OUTPATIENT_CLINIC_OR_DEPARTMENT_OTHER): Payer: Self-pay | Admitting: *Deleted

## 2023-10-31 ENCOUNTER — Encounter (HOSPITAL_COMMUNITY): Payer: Self-pay | Admitting: Pulmonary Disease

## 2023-10-31 LAB — CULTURE, BLOOD (ROUTINE X 2): Culture: NO GROWTH

## 2023-10-31 LAB — CULTURE, RESPIRATORY W GRAM STAIN: Gram Stain: NONE SEEN

## 2023-10-31 LAB — ASPERGILLUS ANTIGEN, BAL/SERUM: Aspergillus Ag, BAL/Serum: 0.03 {index} (ref 0.00–0.49)

## 2023-10-31 LAB — PNEUMOCYSTIS JIROVECI SMEAR BY DFA: Pneumocystis jiroveci Ag: NEGATIVE

## 2023-10-31 NOTE — Telephone Encounter (Signed)
 Post ED Visit - Positive Culture Follow-up  Culture report reviewed by antimicrobial stewardship pharmacist: [facility] []  Rankin Dee, Pharm.D. []  Venetia Gully, Pharm.D., BCPS AQ-ID []  Garrel Crews, Pharm.D., BCPS []  Almarie Lunger, Pharm.D., BCPS []  [place], [place].D., BCPS, AAHIVP []  Rosaline Bihari, Pharm.D., BCPS, AAHIVP []  Vernell Meier, PharmD, BCPS []  Latanya Hint, PharmD, BCPS []  Donald Medley, PharmD, BCPS []  Rocky Bold, PharmD []  Dorothyann Alert, PharmD, BCPS []  Morene Babe, PharmD  [facility] []  Rosaline Edison, PharmD []  Romona Bliss, PharmD []  Dolphus Roller, PharmD []  Veva Seip, Rph []  Vernell Daunt) Leonce, PharmD []  Eva Allis, PharmD []  Rosaline Millet, PharmD []  Iantha Batch, PharmD []  Arvin Gauss, PharmD []  Wanda Hasting, PharmD []  Ronal Rav, PharmD []  Rocky Slade, PharmD [x]  Damien Quiet, PharmD   Positive blood culture Likely contaminant. Already reviewed by Dr.Michael Bero on 10/24. No treatment needed  Albino Alan Novak 10/31/2023, 8:50 AM

## 2023-11-02 LAB — CYTOLOGY - NON PAP

## 2023-11-09 ENCOUNTER — Encounter: Payer: Self-pay | Admitting: Infectious Diseases

## 2023-11-28 LAB — FUNGAL ORGANISM REFLEX

## 2023-11-28 LAB — FUNGUS CULTURE RESULT

## 2023-11-28 LAB — FUNGUS CULTURE WITH STAIN

## 2023-12-12 LAB — ACID FAST CULTURE WITH REFLEXED SENSITIVITIES (MYCOBACTERIA): Acid Fast Culture: NEGATIVE

## 2023-12-15 ENCOUNTER — Other Ambulatory Visit (HOSPITAL_COMMUNITY): Payer: Self-pay | Admitting: Physical Medicine and Rehabilitation

## 2023-12-15 DIAGNOSIS — C8590 Non-Hodgkin lymphoma, unspecified, unspecified site: Secondary | ICD-10-CM

## 2023-12-19 ENCOUNTER — Encounter (HOSPITAL_COMMUNITY): Payer: Self-pay

## 2023-12-19 ENCOUNTER — Inpatient Hospital Stay (HOSPITAL_COMMUNITY)
Admission: EM | Admit: 2023-12-19 | Discharge: 2023-12-21 | Disposition: A | Source: Ambulatory Visit | Attending: Internal Medicine | Admitting: Internal Medicine

## 2023-12-19 ENCOUNTER — Emergency Department (HOSPITAL_COMMUNITY)

## 2023-12-19 ENCOUNTER — Other Ambulatory Visit: Payer: Self-pay

## 2023-12-19 DIAGNOSIS — Z823 Family history of stroke: Secondary | ICD-10-CM

## 2023-12-19 DIAGNOSIS — D72829 Elevated white blood cell count, unspecified: Secondary | ICD-10-CM | POA: Insufficient documentation

## 2023-12-19 DIAGNOSIS — D7389 Other diseases of spleen: Secondary | ICD-10-CM | POA: Diagnosis not present

## 2023-12-19 DIAGNOSIS — R197 Diarrhea, unspecified: Secondary | ICD-10-CM | POA: Diagnosis present

## 2023-12-19 DIAGNOSIS — J188 Other pneumonia, unspecified organism: Secondary | ICD-10-CM | POA: Diagnosis present

## 2023-12-19 DIAGNOSIS — K769 Liver disease, unspecified: Secondary | ICD-10-CM | POA: Diagnosis present

## 2023-12-19 DIAGNOSIS — J3801 Paralysis of vocal cords and larynx, unilateral: Secondary | ICD-10-CM

## 2023-12-19 DIAGNOSIS — Z87892 Personal history of anaphylaxis: Secondary | ICD-10-CM

## 2023-12-19 DIAGNOSIS — Z87891 Personal history of nicotine dependence: Secondary | ICD-10-CM | POA: Diagnosis not present

## 2023-12-19 DIAGNOSIS — Y95 Nosocomial condition: Secondary | ICD-10-CM | POA: Diagnosis present

## 2023-12-19 DIAGNOSIS — Z8249 Family history of ischemic heart disease and other diseases of the circulatory system: Secondary | ICD-10-CM

## 2023-12-19 DIAGNOSIS — J150 Pneumonia due to Klebsiella pneumoniae: Principal | ICD-10-CM | POA: Diagnosis present

## 2023-12-19 DIAGNOSIS — J189 Pneumonia, unspecified organism: Principal | ICD-10-CM | POA: Insufficient documentation

## 2023-12-19 DIAGNOSIS — S21002A Unspecified open wound of left breast, initial encounter: Secondary | ICD-10-CM | POA: Diagnosis present

## 2023-12-19 DIAGNOSIS — Z880 Allergy status to penicillin: Secondary | ICD-10-CM

## 2023-12-19 DIAGNOSIS — E876 Hypokalemia: Secondary | ICD-10-CM | POA: Diagnosis present

## 2023-12-19 DIAGNOSIS — Z79899 Other long term (current) drug therapy: Secondary | ICD-10-CM

## 2023-12-19 DIAGNOSIS — C8190 Hodgkin lymphoma, unspecified, unspecified site: Secondary | ICD-10-CM | POA: Diagnosis present

## 2023-12-19 DIAGNOSIS — D509 Iron deficiency anemia, unspecified: Secondary | ICD-10-CM | POA: Insufficient documentation

## 2023-12-19 DIAGNOSIS — J918 Pleural effusion in other conditions classified elsewhere: Secondary | ICD-10-CM | POA: Diagnosis present

## 2023-12-19 DIAGNOSIS — E871 Hypo-osmolality and hyponatremia: Secondary | ICD-10-CM

## 2023-12-19 DIAGNOSIS — J45909 Unspecified asthma, uncomplicated: Secondary | ICD-10-CM

## 2023-12-19 DIAGNOSIS — L03313 Cellulitis of chest wall: Secondary | ICD-10-CM

## 2023-12-19 DIAGNOSIS — R590 Localized enlarged lymph nodes: Secondary | ICD-10-CM

## 2023-12-19 DIAGNOSIS — Z881 Allergy status to other antibiotic agents status: Secondary | ICD-10-CM

## 2023-12-19 DIAGNOSIS — Z820 Family history of epilepsy and other diseases of the nervous system: Secondary | ICD-10-CM | POA: Diagnosis not present

## 2023-12-19 LAB — CBC WITH DIFFERENTIAL/PLATELET
Abs Immature Granulocytes: 1.16 K/uL — ABNORMAL HIGH (ref 0.00–0.07)
Basophils Absolute: 0.2 K/uL — ABNORMAL HIGH (ref 0.0–0.1)
Basophils Relative: 0 %
Eosinophils Absolute: 0.8 K/uL — ABNORMAL HIGH (ref 0.0–0.5)
Eosinophils Relative: 2 %
HCT: 27.1 % — ABNORMAL LOW (ref 36.0–46.0)
Hemoglobin: 8 g/dL — ABNORMAL LOW (ref 12.0–15.0)
Immature Granulocytes: 3 %
Lymphocytes Relative: 4 %
Lymphs Abs: 1.4 K/uL (ref 0.7–4.0)
MCH: 25.2 pg — ABNORMAL LOW (ref 26.0–34.0)
MCHC: 29.5 g/dL — ABNORMAL LOW (ref 30.0–36.0)
MCV: 85.2 fL (ref 80.0–100.0)
Monocytes Absolute: 3.7 K/uL — ABNORMAL HIGH (ref 0.1–1.0)
Monocytes Relative: 10 %
Neutro Abs: 31 K/uL — ABNORMAL HIGH (ref 1.7–7.7)
Neutrophils Relative %: 81 %
Platelets: 476 K/uL — ABNORMAL HIGH (ref 150–400)
RBC: 3.18 MIL/uL — ABNORMAL LOW (ref 3.87–5.11)
RDW: 20.9 % — ABNORMAL HIGH (ref 11.5–15.5)
Smear Review: NORMAL
WBC: 38.2 K/uL — ABNORMAL HIGH (ref 4.0–10.5)
nRBC: 0.1 % (ref 0.0–0.2)

## 2023-12-19 LAB — IRON AND TIBC: Iron: 22 ug/dL — ABNORMAL LOW (ref 28–170)

## 2023-12-19 LAB — COMPREHENSIVE METABOLIC PANEL WITH GFR
ALT: 16 U/L (ref 0–44)
AST: 30 U/L (ref 15–41)
Albumin: 2.4 g/dL — ABNORMAL LOW (ref 3.5–5.0)
Alkaline Phosphatase: 375 U/L — ABNORMAL HIGH (ref 38–126)
Anion gap: 11 (ref 5–15)
BUN: 7 mg/dL (ref 6–20)
CO2: 28 mmol/L (ref 22–32)
Calcium: 10.4 mg/dL — ABNORMAL HIGH (ref 8.9–10.3)
Chloride: 97 mmol/L — ABNORMAL LOW (ref 98–111)
Creatinine, Ser: 0.47 mg/dL (ref 0.44–1.00)
GFR, Estimated: >60 mL/min (ref >60)
Glucose, Bld: 99 mg/dL (ref 70–99)
Potassium: 3.1 mmol/L — ABNORMAL LOW (ref 3.5–5.1)
Sodium: 136 mmol/L (ref 135–145)
Total Bilirubin: 0.9 mg/dL (ref 0.0–1.2)
Total Protein: 6.6 g/dL (ref 6.5–8.1)

## 2023-12-19 LAB — HCG, QUANTITATIVE, PREGNANCY: hCG, Beta Chain, Quant, S: <1 m[IU]/mL (ref <5)

## 2023-12-19 LAB — C DIFFICILE QUICK SCREEN W PCR REFLEX
C Diff antigen: NEGATIVE
C Diff interpretation: NOT DETECTED
C Diff toxin: NEGATIVE

## 2023-12-19 LAB — FERRITIN: Ferritin: >7500 ng/mL — ABNORMAL HIGH (ref 11–307)

## 2023-12-19 LAB — POC OCCULT BLOOD, ED: Fecal Occult Bld: NEGATIVE

## 2023-12-19 LAB — I-STAT CG4 LACTIC ACID, ED: Lactic Acid, Venous: 1.7 mmol/L (ref 0.5–1.9)

## 2023-12-19 MED ORDER — VANCOMYCIN HCL IN DEXTROSE 1-5 GM/200ML-% IV SOLN
1000.0000 mg | Freq: Once | INTRAVENOUS | Status: AC
  Administered 2023-12-19: 18:00:00 1000 mg via INTRAVENOUS
  Filled 2023-12-19: qty 200

## 2023-12-19 MED ORDER — LACTATED RINGERS IV SOLN: INTRAVENOUS | Status: AC

## 2023-12-19 MED ORDER — HEPARIN SOD (PORK) LOCK FLUSH 100 UNIT/ML IV SOLN
INTRAVENOUS | Status: AC
  Filled 2023-12-19: qty 5

## 2023-12-19 MED ORDER — GUAIFENESIN-DM 100-10 MG/5ML PO SYRP: 5.0000 mL | ORAL_SOLUTION | ORAL | Status: DC | PRN

## 2023-12-19 MED ORDER — SODIUM CHLORIDE 0.9% FLUSH
10.0000 mL | Freq: Two times a day (BID) | INTRAVENOUS | Status: DC
  Administered 2023-12-19: 22:00:00 20 mL
  Administered 2023-12-20 – 2023-12-21 (×2): 10 mL

## 2023-12-19 MED ORDER — LEVOFLOXACIN IN D5W 500 MG/100ML IV SOLN
500.0000 mg | Freq: Every day | INTRAVENOUS | Status: DC
  Filled 2023-12-19: qty 100

## 2023-12-19 MED ORDER — CHLORHEXIDINE GLUCONATE CLOTH 2 % EX PADS
6.0000 | MEDICATED_PAD | Freq: Every day | CUTANEOUS | Status: DC
  Administered 2023-12-20 – 2023-12-21 (×2): 6 via TOPICAL

## 2023-12-19 MED ORDER — MORPHINE SULFATE (PF) 2 MG/ML IV SOLN: 2.0000 mg | INTRAVENOUS | Status: DC | PRN

## 2023-12-19 MED ORDER — POTASSIUM CHLORIDE CRYS ER 20 MEQ PO TBCR
40.0000 meq | EXTENDED_RELEASE_TABLET | Freq: Once | ORAL | Status: DC
  Filled 2023-12-19: qty 2

## 2023-12-19 MED ORDER — IBUPROFEN 200 MG PO TABS
600.0000 mg | ORAL_TABLET | Freq: Three times a day (TID) | ORAL | Status: DC | PRN
  Administered 2023-12-19: 21:00:00 600 mg via ORAL
  Filled 2023-12-19: qty 3

## 2023-12-19 MED ORDER — LACTATED RINGERS IV BOLUS
1500.0000 mL | Freq: Once | INTRAVENOUS | Status: AC
  Administered 2023-12-19: 16:00:00 1500 mL via INTRAVENOUS

## 2023-12-19 MED ORDER — ONDANSETRON HCL 4 MG PO TABS
4.0000 mg | ORAL_TABLET | Freq: Four times a day (QID) | ORAL | Status: DC | PRN
  Filled 2023-12-19: qty 1

## 2023-12-19 MED ORDER — SODIUM CHLORIDE 0.9 % IV SOLN
2.0000 g | Freq: Once | INTRAVENOUS | Status: AC
  Administered 2023-12-19: 15:00:00 2 g via INTRAVENOUS
  Filled 2023-12-19: qty 12.5

## 2023-12-19 MED ORDER — MELATONIN 5 MG PO TABS
5.0000 mg | ORAL_TABLET | Freq: Once | ORAL | Status: AC
  Administered 2023-12-19: 22:00:00 5 mg via ORAL
  Filled 2023-12-19: qty 1

## 2023-12-19 MED ORDER — SODIUM CHLORIDE 0.9% FLUSH
10.0000 mL | INTRAVENOUS | Status: DC | PRN
  Administered 2023-12-19: 21:00:00 10 mL
  Administered 2023-12-21: 18:00:00 20 mL
  Administered 2023-12-21: 11:00:00 10 mL

## 2023-12-19 MED ORDER — OXYCODONE HCL 5 MG PO TABS: 5.0000 mg | ORAL_TABLET | Freq: Four times a day (QID) | ORAL | Status: DC | PRN

## 2023-12-19 MED ORDER — VANCOMYCIN HCL IN DEXTROSE 1-5 GM/200ML-% IV SOLN
1000.0000 mg | Freq: Two times a day (BID) | INTRAVENOUS | Status: DC
  Administered 2023-12-20 – 2023-12-21 (×3): 1000 mg via INTRAVENOUS
  Filled 2023-12-19 (×3): qty 200

## 2023-12-19 MED ORDER — SODIUM CHLORIDE 0.9 % IV SOLN
2.0000 g | Freq: Three times a day (TID) | INTRAVENOUS | Status: DC
  Administered 2023-12-20 – 2023-12-21 (×6): 2 g via INTRAVENOUS
  Filled 2023-12-19 (×7): qty 12.5

## 2023-12-19 MED ORDER — IPRATROPIUM-ALBUTEROL 0.5-2.5 (3) MG/3ML IN SOLN: 3.0000 mL | Freq: Four times a day (QID) | RESPIRATORY_TRACT | Status: DC | PRN

## 2023-12-19 MED ORDER — LACTATED RINGERS IV BOLUS
1000.0000 mL | Freq: Once | INTRAVENOUS | Status: AC
  Administered 2023-12-19: 14:00:00 1000 mL via INTRAVENOUS

## 2023-12-19 MED ORDER — ONDANSETRON HCL 4 MG/2ML IJ SOLN: 4.0000 mg | Freq: Four times a day (QID) | INTRAMUSCULAR | Status: DC | PRN

## 2023-12-19 MED ORDER — ENOXAPARIN SODIUM 40 MG/0.4ML IJ SOSY
40.0000 mg | PREFILLED_SYRINGE | INTRAMUSCULAR | Status: DC
  Filled 2023-12-19: qty 0.4

## 2023-12-19 MED ORDER — FERROUS SULFATE 325 (65 FE) MG PO TABS
325.0000 mg | ORAL_TABLET | Freq: Every day | ORAL | Status: DC
  Administered 2023-12-20 – 2023-12-21 (×2): 325 mg via ORAL
  Filled 2023-12-19 (×2): qty 1

## 2023-12-19 MED ORDER — HYDROXYZINE HCL 25 MG PO TABS
25.0000 mg | ORAL_TABLET | Freq: Three times a day (TID) | ORAL | Status: DC | PRN
  Administered 2023-12-19: 21:00:00 25 mg via ORAL
  Filled 2023-12-19: qty 1

## 2023-12-19 NOTE — Consult Note (Signed)
 WOC Nurse Consult Note: Reason for Consult: L breast wounds  Wound type: full thickness unknown etiology ? Malignancy (has Hodgkin's Lymphoma) versus caused by treatment for lymphoma  Pressure Injury POA: NA  Measurement: see nursing flowsheet  Wound bed:L anterior breast mixture of pink moist and tan tissue; L lateral breast with some scattered eschar; L axilla tan and pink  Drainage (amount, consistency, odor) tan per notes  Periwound: indurated, hyperpigmented  Dressing procedure/placement/frequency: Cleanse L breast/axillary wounds with Vashe, do not rinse.  Apply Xeroform gauze (Lawson 294) to wound beds daily and secure with ABD pad and clothe tape.  May  cut the crotch out of mesh underwear and attempt to use like a tube top to hold dressing in place.   POC discussed with bedside nurse.  This wound would benefit from ongoing management at wound care center/oncology office.    WOC team will not follow. Re-consult if further needs arise.   Thank you,    Powell Bar MSN, RN-BC, [employer]

## 2023-12-19 NOTE — Consult Note (Incomplete)
 Bronch with BAL performed on 10/24/2023: BAL cultures were negative and rare Enterobacter cloacae and Klebsiella pneumoniae organisms were seen.  Aspergillus and crypto were negative.  Quant was indeterminate.  CT scan 09/02/2023 reviewed: Left upper lobe cavitary lesion along with

## 2023-12-19 NOTE — Plan of Care (Signed)

## 2023-12-19 NOTE — Progress Notes (Signed)
 Patient refused skin assessment. Patient unwilling for this writer and Samjhana LPN to look at wounds.

## 2023-12-19 NOTE — Progress Notes (Signed)
 A consult was received from an ED physician for vancomycin  and cefepime  per pharmacy dosing (for an indication other than meningitis). The patient's profile has been reviewed for ht/wt/allergies/indication/available labs. A one time order has been placed for the above antibiotics.  Further antibiotics/pharmacy consults should be ordered by admitting physician if indicated.                       Bard Jeans, PharmD, BCPS [phone] 12/19/2023, 2:47 PM

## 2023-12-19 NOTE — ED Provider Notes (Signed)
 [place] EMERGENCY DEPARTMENT AT [facility] Provider Note   CSN: [id number] Arrival date & time: 12/19/23  1224     Patient presents with: Abnormal Labs   Briana Pham is a 38 y.o. female with PMHx anxiety, asthma, hodgkin lymphoma on non-traditional treatment who presents to ED concerned for fatigue, cough, tachycardia, and diarrhea over the past 1 month. Patient stating that she had an bronchoscopy 1 month ago which she thinks is the reason for her cough and other symptoms as symptoms started shortly afterwards. Results from bronchoscopy have all came back negative over the past month. Patient denies fever, chest pain, SOB, nausea, vomiting, hematochezia, hematemesis. Endorses hx of asthma but states that it has been well-controlled recently and denies current SOB.  Patient stating that she was sent to ED today for a RBC transfusion since her hgb was in the 8's. Outpatient provider note stating that patient needs RBC transfusions to stabilize her hgb before she starts EBOO infusions.   HPI     Prior to Admission medications  Medication Sig Start Date End Date Taking? Authorizing Provider  ferrous sulfate  325 (65 FE) MG tablet Take 1 tablet (325 mg total) by mouth daily with breakfast. 01/03/22 10/10/23  Shivaji, Lavonia HERO, MD  ibuprofen  (ADVIL ) 800 MG tablet Take 1 tablet (800 mg total) by mouth every 8 (eight) hours as needed. 01/03/22   Shivaji, Lavonia HERO, MD  oxyCODONE  (OXY IR/ROXICODONE ) 5 MG immediate release tablet Take 1 tablet (5 mg total) by mouth every 6 (six) hours as needed for severe pain. 01/03/22   Shivaji, Lavonia HERO, MD  Prenat MV-Min w/Fe-Folate-DHA (PRENATAL COMPLETE PO) Take 1 tablet by mouth daily.    [provider]    Allergies: Azithromycin and Penicillins    Review of Systems  Constitutional:  Positive for fatigue.    Updated Vital Signs BP 115/82 (BP Location: Left Arm)   Pulse (!) 123   Temp 99.8 F (37.7 C) (Oral)   Resp 16    Ht 5' 7 (1.702 m)   Wt 77.1 kg   SpO2 99%   BMI 26.63 kg/m   Physical Exam Vitals and nursing note reviewed.  Constitutional:      General: She is not in acute distress.    Appearance: She is not ill-appearing or toxic-appearing.  HENT:     Head: Normocephalic and atraumatic.     Mouth/Throat:     Mouth: Mucous membranes are moist.  Eyes:     General: No scleral icterus.       Right eye: No discharge.        Left eye: No discharge.     Conjunctiva/sclera: Conjunctivae normal.  Cardiovascular:     Rate and Rhythm: Normal rate and regular rhythm.     Pulses: Normal pulses.     Heart sounds: Normal heart sounds. No murmur heard. Pulmonary:     Effort: Pulmonary effort is normal. No respiratory distress.     Breath sounds: Normal breath sounds. No wheezing, rhonchi or rales.  Abdominal:     General: Abdomen is flat. Bowel sounds are normal. There is no distension.     Palpations: Abdomen is soft. There is no mass.     Tenderness: There is no abdominal tenderness.  Musculoskeletal:     Right lower leg: No edema.     Left lower leg: No edema.  Skin:    General: Skin is warm and dry.     Findings: No rash.  Neurological:  General: No focal deficit present.     Mental Status: She is alert and oriented to person, place, and time. Mental status is at baseline.  Psychiatric:        Mood and Affect: Mood normal.        Behavior: Behavior normal.     (all labs ordered are listed, but only abnormal results are displayed) Labs Reviewed  CBC WITH DIFFERENTIAL/PLATELET - Abnormal; Notable for the following components:      Result Value   WBC 38.2 (*)    RBC 3.18 (*)    Hemoglobin 8.0 (*)    HCT 27.1 (*)    MCH 25.2 (*)    MCHC 29.5 (*)    RDW 20.9 (*)    Platelets 476 (*)    Neutro Abs 31.0 (*)    Monocytes Absolute 3.7 (*)    Eosinophils Absolute 0.8 (*)    Basophils Absolute 0.2 (*)    Abs Immature Granulocytes 1.16 (*)    All other components within normal  limits  COMPREHENSIVE METABOLIC PANEL WITH GFR - Abnormal; Notable for the following components:   Potassium 3.1 (*)    Chloride 97 (*)    Calcium  10.4 (*)    Albumin 2.4 (*)    Alkaline Phosphatase 375 (*)    All other components within normal limits  RESP PANEL BY RT-PCR (RSV, FLU A&B, COVID)  RVPGX2  CULTURE, BLOOD (ROUTINE X 2)  CULTURE, BLOOD (ROUTINE X 2)  GASTROINTESTINAL PANEL BY PCR, STOOL (REPLACES STOOL CULTURE)  C DIFFICILE QUICK SCREEN W PCR REFLEX    HCG, QUANTITATIVE, PREGNANCY  I-STAT CG4 LACTIC ACID, ED  POC OCCULT BLOOD, ED  TYPE AND SCREEN    EKG: None  Radiology: DG Chest 2 View Result Date: 12/19/2023 CLINICAL DATA:  Cough. EXAM: CHEST - 2 VIEW COMPARISON:  Chest radiograph dated 10/26/2023. FINDINGS: Right-sided PICC with tip at the cavoatrial junction. Small left pleural effusion, new or increased in size since the prior radiograph. Left upper lobe cavitary lesion again noted. Scattered nodular densities in the right lung and diffuse left lung opacities most consistent with pneumonia. Stable cardiac silhouette. No pneumothorax. No acute osseous pathology. IMPRESSION: 1. Small left pleural effusion, new or increased in size since the prior radiograph. 2. Left upper lobe cavitary lesion and bilateral pneumonia. Electronically Signed   By: Vanetta Chou M.D.   On: 12/19/2023 14:21     Procedures   Medications Ordered in the ED  lactated ringers  bolus 1,500 mL (has no administration in time range)  vancomycin  (VANCOCIN ) IVPB 1000 mg/200 mL premix (has no administration in time range)  ceFEPIme  (MAXIPIME ) 2 g in sodium chloride  0.9 % 100 mL IVPB (has no administration in time range)  oxyCODONE  (Oxy IR/ROXICODONE ) immediate release tablet 5 mg (has no administration in time range)  ibuprofen  (ADVIL ) tablet 600 mg (has no administration in time range)  ferrous sulfate  tablet 325 mg (has no administration in time range)  enoxaparin  (LOVENOX ) injection 40 mg  (has no administration in time range)  lactated ringers  infusion (has no administration in time range)  morphine  (PF) 2 MG/ML injection 2 mg (has no administration in time range)  ondansetron  (ZOFRAN ) tablet 4 mg (has no administration in time range)    Or  ondansetron  (ZOFRAN ) injection 4 mg (has no administration in time range)  guaiFENesin -dextromethorphan (ROBITUSSIN DM) 100-10 MG/5ML syrup 5 mL (has no administration in time range)  ipratropium-albuterol  (DUONEB) 0.5-2.5 (3) MG/3ML nebulizer solution 3 mL (has no  administration in time range)  heparin  lock flush 100 UNIT/ML injection (  Given 12/19/23 1312)  lactated ringers  bolus 1,000 mL (1,000 mLs Intravenous New Bag/Given 12/19/23 1346)                                    Medical Decision Making Amount and/or Complexity of Data Reviewed Labs: ordered. Radiology: ordered.  Risk Decision regarding hospitalization.    This patient presents to the ED for concern of cough, fatigue, this involves an extensive number of treatment options, and is a complaint that carries with it a high risk of complications and morbidity.  The differential diagnosis includes Flu/COVID/RSV, strep pharyngitis, sinusitis, pneumonia, meningitis, sepsis.   Co morbidities that complicate the patient evaluation  anxiety, asthma, hodgkin lymphoma on non-traditional treatment   Additional history obtained:  Additional history obtained from biopsy cultures obtained from 10/2023 bronchoscopy reviewed and reassuring.   Problem List / ED Course / Critical interventions / Medication management  Patient presents to the ED concerning for productive cough, fatigue, tachycardia developing over the past 1 month.  Patient believes her symptoms started after the bronchoscopy. Physical exam with tachycardia in the 130's which patient's outpatient provider note states is d/t her anemia. Rest of physical exam  I Ordered, and personally interpreted labs.  CBC with  leukocytosis at 38.2.  There is also anemia with hemoglobin 8.0.  CMP with mild hypokalemia 3.1.  Is also mildly low at 97.  ALP elevated at 375.  hCG negative.  LA 1.7.  Blood cultures pending. I ordered imaging studies including chest xray to assess for process contributing to patient's symptoms. I independently visualized and interpreted imaging which showed bilateral pneumonia and small left pleural effusion. I agree with the radiologist interpretation Shared results with patient.  Answered all questions. Patient is well-appearing. However, patient was provided with 2.5L IV fluids and ABX for healthcare-associated PNA. I reassess patient during fluid repletion with HR coming down to 120's. Will admit patient for HAP. Patient agreeable with plan.  Dr. Rebbecca (hospitalist) admitting provider. They requested that I speak to a Pulmonologist. I did speak to the pulmonologist shortly afterwards who agrees to evaluate patient.  I have reviewed the patients home medicines and have made adjustments as needed   Social Determinants of Health:  none      Final diagnoses:  Healthcare-associated pneumonia    ED Discharge Orders     None          Hoy Nidia FALCON, [place] 12/19/23 1505    Doretha Folks, MD 12/19/23 1529

## 2023-12-19 NOTE — Progress Notes (Signed)
 Pharmacy Antibiotic Note  Briana Pham is a 38 y.o. female admitted on 12/19/2023 with pneumonia.  Pharmacy has been consulted for Cefepime  + Vancomycin  dosing.  Pt undergoing treatment for Hodgkin's lymphoma s/p bronchoscopy on 10/28/23 for cough which resulted in rate enterobacter cloacae & klebsiella pneumoniae sensitive to Cefepime .  Pt states symptoms started after procedure.  CXR today + bilateral pneumonia and LUL cavitary lesion.   Noted to have leukocystosis, fever on admission which has resolved & Excellent renal function.   Plan: Cefepime  2gm IV q8h Vancomycin  1gm IV q12h for estimated AUC 451 (goal AUC 400-550) Check MRSA PCR Monitor renal function and cx data    Height: 5' 7 (170.2 cm) Weight: 77.1 kg (170 lb) IBW/kg (Calculated) : 61.6  Temp (24hrs), Avg:99.5 F (37.5 C), Min:97.9 F (36.6 C), Max:101.3 F (38.5 C)  Recent Labs  Lab 12/19/23 1250 12/19/23 1446  WBC 38.2*  --   CREATININE 0.47  --   LATICACIDVEN  --  1.7    Estimated Creatinine Clearance: 100.1 mL/min (by C-G formula based on SCr of 0.47 mg/dL).    Allergies[1]  Antimicrobials this admission: 12/15 Cefepime  >>  12/15 Vancomycin  >>   Dose adjustments this admission:  Microbiology results: 12/15 BCx:  12/15 GI/Cdiff PCR: negative Sputum Cx: Resp PCR: MRSA PCR:   Thank you for allowing pharmacy to be a part of this patients care.  Rosaline Millet PharmD 12/19/2023 11:46 PM     [1]  Allergies Allergen Reactions   Azithromycin Anaphylaxis and Itching    Rash, crawly skin, could not form sentences Other reaction(s): Confusion   Penicillins Hives and Itching    Confusion Other reaction(s): Confusion ALL CILLIN FAMILY*

## 2023-12-19 NOTE — Consult Note (Addendum)
 NAME:  SELIA WAREING, MRN:  [id number], DOB:  1985/01/09, LOS: 0 ADMISSION DATE:  12/19/2023, CONSULTATION DATE:  12/19/23 REFERRING MD:  Jillian , CHIEF COMPLAINT:   abnormal CXR  History of Present Illness:   38 yo F PMH hodgkins lymphoma, unilateral vocal cord paralysis, asthma, who presented to ED 12/19/23 at request of her outpatient team for evaluation of abnormal labs.  In ED she had a CXR which revealed LUL cavitary lesion (not new) and bilat ASD   Pt underwent a bronch 10/2023 and since that time has felt that she has had a persistent cough, though in chart review I also see c/o cough preceding bronch related to her cavitary lung lesion which has been there for months and refractory to multiple abx courses. Bronch w enterobacter and klebsiella but non revealing otherwise  like post bronch cough is mostly dry, at times produces clear/white sputum. Feels VCD makes it more difficult to cough.   Generally she feels poor. States she completed a steroid course about 2 weeks ago and since then has had worse fatigue lethargy brain fog and generalized weakness.  Sister shares that pt breast wounds are worse. Pt says she used too much of a medication (it sounds like she may be on non-traditional Ca tx) and that while usually she gets some wounds from this medication, it is worse this time x about 3 weeks + associated L breast swelling. Wounds are itchy, have creamy exudate. Swelling is severe enough that pt has had to change daily attire, but pt/family feel it is improved some in the last week or so.   Pertinent  Medical History   Hodgkins lymphoma   Significant Hospital Events: Including procedures, antibiotic start and stop dates in addition to other pertinent events   12/15 admit [facility]. PCCM consult for abnormal CXR   Interim History / Subjective:  Starting abx   Objective    Blood pressure 128/76, pulse (!) 125, temperature 98 F (36.7 C), resp. rate 17, height 5' 7 (1.702 m),  weight 77.1 kg, SpO2 96%, not currently breastfeeding.        Intake/Output Summary (Last 24 hours) at 12/19/2023 1658 Last data filed at 12/19/2023 1550 Gross per 24 hour  Intake 1100 ml  Output --  Net 1100 ml   Filed Weights   12/19/23 1242  Weight: 77.1 kg    Examination: General: chronically ill appearing middle aged F NAD  HENT: NCAT  Lungs: CTAb  Cardiovascular: rr Abdomen: soft  Extremities: no acute joint deformity  Neuro: AAOx4  GU: defer Skin: numerous ulcerative appearing wounds on L breast/chest, most with creamy/yellow exudate, an area that looks like eschar. 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 Looks like she refused viral swab -I have ordered a WOC consult -- she would like female eval of wounds only.  -Have reached out to [facility] -- not sure if it might be feasible to have her on a female physician team, but worth checking as otherwise I worry evaluation may be limited  -send wound cx  -as she is overall non-toxic  appearing, no crepitus on exam, think ok to keep vanc   -tried calling the [facility] where [facility] ca tx, but there were closed. This would be helpful especially for knowing what has caused the breast wounds   Not sure that there is much more for pulm to add acutely. Do think she needs outpt f/u   Labs   CBC: Recent Labs  Lab 12/19/23 1250  WBC 38.2*  NEUTROABS 31.0*  HGB 8.0*  HCT 27.1*  MCV 85.2  PLT 476*    Basic Metabolic Panel: Recent Labs  Lab 12/19/23 1250  NA 136  K 3.1*  CL 97*   CO2 28  GLUCOSE 99  BUN 7  CREATININE 0.47  CALCIUM  10.4*   GFR: Estimated Creatinine Clearance: 100.1 mL/min (by C-G formula based on SCr of 0.47 mg/dL). Recent Labs  Lab 12/19/23 1250 12/19/23 1446  WBC 38.2*  --   LATICACIDVEN  --  1.7    Liver Function Tests: Recent Labs  Lab 12/19/23 1250  AST 30  ALT 16  ALKPHOS 375*  BILITOT 0.9  PROT 6.6  ALBUMIN 2.4*   No results for input(s): LIPASE, AMYLASE in the last 168 hours. No results for input(s): AMMONIA in the last 168 hours.  ABG No results found for: PHART, PCO2ART, PO2ART, HCO3, TCO2, ACIDBASEDEF, O2SAT   Coagulation Profile: No results for input(s): INR, PROTIME in the last 168 hours.  Cardiac Enzymes: No results for input(s): CKTOTAL, CKMB, CKMBINDEX, TROPONINI in the last 168 hours.  HbA1C: No results found for: HGBA1C  CBG: No results for input(s): GLUCAP in the last 168 hours.  Review of Systems:   Review of Systems  Constitutional:  Positive for malaise/fatigue and weight loss.  Respiratory:  Positive for cough and wheezing. Negative for hemoptysis and shortness of breath.   Skin:  Positive for itching and rash.     Past Medical History:  She,  has a past medical history of Anxiety, Arrest of dilation, delivered, current hospitalization (12/10/2018), Asthma, Cavitary lesion of lung, Insomnia, Lymphoma (HCC), Mental disorder, Pregnancy induced hypertension, and Unilateral vocal cord paralysis.   Surgical History:   Past Surgical History:  Procedure Laterality Date   CESAREAN SECTION N/A 12/10/2018   Procedure: CESAREAN SECTION;  Surgeon: Delana Ted Morrison, DO;  Location: [facility];  Service: Obstetrics;  Laterality: N/A;   CESAREAN SECTION N/A 01/01/2022   Procedure: CESAREAN SECTION;  Surgeon: Delana Ted Morrison, DO;  Location: [facility];  Service: Obstetrics;  Laterality: N/A;  request RNFA or Fellow   HYSTEROSCOPY     IR PATIENT EVAL TECH 0-60  MINS  08/09/2023   IR PATIENT EVAL TECH 0-60 MINS  08/17/2023   ivf     MOUTH SURGERY     VIDEO BRONCHOSCOPY N/A 10/28/2023   Procedure: VIDEO BRONCHOSCOPY WITHOUT FLUORO;  Surgeon: Annella Donnice SAUNDERS, MD;  Location: [facility] END[facility];  Service: Endoscopy;  Laterality: N/A;   WISDOM TOOTH EXTRACTION       Social History:   reports that she has quit smoking. Her smoking use included cigarettes. She has never used smokeless tobacco. She reports that she does not drink alcohol and does not use drugs.   Family History:  Her family history includes Heart disease in her father; Multiple sclerosis in her mother; Stroke in her father.   Allergies Allergies[1]   Home Medications  Prior to Admission medications  Medication Sig Start Date End Date Taking? Authorizing Provider  ferrous sulfate  325 (65  FE) MG tablet Take 1 tablet (325 mg total) by mouth daily with breakfast. 01/03/22 10/10/23  Shivaji, Lavonia HERO, MD  ibuprofen  (ADVIL ) 800 MG tablet Take 1 tablet (800 mg total) by mouth every 8 (eight) hours as needed. 01/03/22   Shivaji, Lavonia HERO, MD  oxyCODONE  (OXY IR/ROXICODONE ) 5 MG immediate release tablet Take 1 tablet (5 mg total) by mouth every 6 (six) hours as needed for severe pain. 01/03/22   Shivaji, Lavonia HERO, MD  Prenat MV-Min w/Fe-Folate-DHA (PRENATAL COMPLETE PO) Take 1 tablet by mouth daily.    [provider]     Critical care time: na       Ronnald Gave MSN, AGACNP-BC [place] [facility] Amion for pager  12/19/2023, 4:58 PM         [1]  Allergies Allergen Reactions   Azithromycin Anaphylaxis and Itching    Rash, crawly skin, could not form sentences Other reaction(s): Confusion   Penicillins Hives and Itching    Confusion Other reaction(s): Confusion ALL CILLIN FAMILY*

## 2023-12-19 NOTE — ED Triage Notes (Signed)
 Pt sent here by PCP for blood transfusion and evaluation of elevated heart rate. C/o fatigue.

## 2023-12-19 NOTE — H&P (Addendum)
 History and Physical    Briana Pham FMW:[id number] DOB: 1986-01-01 DOA: 12/19/2023  PCP: Ambrosio Elouise HERO, MD   Patient coming from: Home    Chief Complaint: Sent by PCP for hemoglobin of 8.  HPI: Briana Pham is a 38 y.o. female with medical history significant of anxiety, asthma, Hodgkin's lymphoma, chronic iron  deficiency  anemia ,who presented to the ED after she was sent by her PCP for the evaluation of low hemoglobin of 8.  Patient was also reporting fatigue, cough, fast heart beat, diarrhea for about a month. She had presented to the emergency department on 10/26/2023 for further evaluation of left upper lobe cavitary lesion, found to have leukocytosis.  At that time she was seen by ID and pulmonology.  ID did not recommend any antibiotics for the time because she was asymptomatic.  She was seen by pulmonology and was discharged on the same day for plan for bronchoscopy.  She was discharged on a stable condition without antibiotics. She underwent bronchoscopy by Dr. Annella on 10/24.  Specimen was sent for cell count, cytology, bacterial/fungal/AFB, Aspergillus, pneumocystis, toxoplasma, cryptococcus, QuantiFERON etc.  Most of these tests have been negative. Few days after bronchoscopy, she started having cough and shortness of breath.  She also developed fatigue, fast heartbeat.  She believes the symptoms have started after bronchoscopy.  She was seen by her PCP today and was found to have a hemoglobin of 8 and was sent to the ED for consideration of blood transfusion. Lab work done in the ED today showed WC count of 38.2, hemoglobin of 8, potassium 3.1, ALP of 375, lactic acid of 1.7. Chest x-ray showed small left pleural effusion, new or increased in size since the prior radiograph,left upper lobe cavitary lesion and bilateral pneumonia. Patient seen and examined at bedside in the emergency department.  During my evaluation, she was hemodynamically stable, afebrile.   She was in mild sinus tachycardia with a stable blood pressure.   ED Course: Remained overall comfortable.  Patient was started on broad-spectrum antibiotics with vancomycin  and cefepime .  Culture sent.  Patient admitted for further management of healthcare associated pneumonia.  Review of Systems: As per HPI otherwise 10 point review of systems negative.    Past Medical History:  Diagnosis Date   Anxiety    Arrest of dilation, delivered, current hospitalization 12/10/2018   Asthma    inhaler used in 2017   Cavitary lesion of lung    LUL   Insomnia    Lymphoma (HCC)    Mental disorder    Pregnancy induced hypertension    Unilateral vocal cord paralysis    Left    Past Surgical History:  Procedure Laterality Date   CESAREAN SECTION N/A 12/10/2018   Procedure: CESAREAN SECTION;  Surgeon: Delana Ted Morrison, DO;  Location: [facility];  Service: Obstetrics;  Laterality: N/A;   CESAREAN SECTION N/A 01/01/2022   Procedure: CESAREAN SECTION;  Surgeon: Delana Ted Morrison, DO;  Location: [facility];  Service: Obstetrics;  Laterality: N/A;  request RNFA or Fellow   HYSTEROSCOPY     IR PATIENT EVAL TECH 0-60 MINS  08/09/2023   IR PATIENT EVAL TECH 0-60 MINS  08/17/2023   ivf     MOUTH SURGERY     VIDEO BRONCHOSCOPY N/A 10/28/2023   Procedure: VIDEO BRONCHOSCOPY WITHOUT FLUORO;  Surgeon: Annella Donnice SAUNDERS, MD;  Location: [facility];  Service: Endoscopy;  Laterality: N/A;   WISDOM TOOTH EXTRACTION  reports that she has quit smoking. Her smoking use included cigarettes. She has never used smokeless tobacco. She reports that she does not drink alcohol and does not use drugs.  Allergies[1]  Family History  Problem Relation Age of Onset   Multiple sclerosis Mother    Heart disease Father    Stroke Father      Prior to Admission medications  Medication Sig Start Date End Date Taking? Authorizing Provider  ferrous sulfate  325 (65 FE) MG tablet Take 1 tablet (325 mg total)  by mouth daily with breakfast. 01/03/22 10/10/23  Shivaji, Lavonia HERO, MD  ibuprofen  (ADVIL ) 800 MG tablet Take 1 tablet (800 mg total) by mouth every 8 (eight) hours as needed. 01/03/22   Shivaji, Lavonia HERO, MD  oxyCODONE  (OXY IR/ROXICODONE ) 5 MG immediate release tablet Take 1 tablet (5 mg total) by mouth every 6 (six) hours as needed for severe pain. 01/03/22   Shivaji, Lavonia HERO, MD  Prenat MV-Min w/Fe-Folate-DHA (PRENATAL COMPLETE PO) Take 1 tablet by mouth daily.    [provider]    Physical Exam: Vitals:   12/19/23 1415 12/19/23 1421 12/19/23 1423 12/19/23 1504  BP:   115/82   Pulse: (!) 123  (!) 123   Resp:  14 16   Temp:    97.9 F (36.6 C)  TempSrc:    Oral  SpO2: 98%  99%   Weight:      Height:        Constitutional: NAD, calm, comfortable Vitals:   12/19/23 1415 12/19/23 1421 12/19/23 1423 12/19/23 1504  BP:   115/82   Pulse: (!) 123  (!) 123   Resp:  14 16   Temp:    97.9 F (36.6 C)  TempSrc:    Oral  SpO2: 98%  99%   Weight:      Height:       Eyes: PERRL, lids and conjunctivae normal ENMT: Mucous membranes are moist.  Neck: normal, supple, no masses, no thyromegaly Respiratory: Mildly diminished air sounds bilaterally, no wheezing, no crackles. Normal respiratory effort. No accessory muscle use.  Cardiovascular: Regular rate and rhythm, no murmurs / rubs / gallops. No extremity edema.  Abdomen: no tenderness, no masses palpated. No hepatosplenomegaly. Bowel sounds positive.  Musculoskeletal: no clubbing / cyanosis. No joint deformity upper and lower extremities.  Skin: no rashes, lesions, ulcers. No induration Neurologic: CN 2-12 grossly intact.  Strength 5/5 in all 4.  Psychiatric: Normal judgment and insight. Alert and oriented x 3. Normal mood.   Foley Catheter:None  Labs on Admission: I have personally reviewed following labs and imaging studies  CBC: Recent Labs  Lab 12/19/23 1250  WBC 38.2*  NEUTROABS 31.0*  HGB 8.0*  HCT 27.1*  MCV  85.2  PLT 476*   Basic Metabolic Panel: Recent Labs  Lab 12/19/23 1250  NA 136  K 3.1*  CL 97*  CO2 28  GLUCOSE 99  BUN 7  CREATININE 0.47  CALCIUM  10.4*   GFR: Estimated Creatinine Clearance: 100.1 mL/min (by C-G formula based on SCr of 0.47 mg/dL). Liver Function Tests: Recent Labs  Lab 12/19/23 1250  AST 30  ALT 16  ALKPHOS 375*  BILITOT 0.9  PROT 6.6  ALBUMIN 2.4*   No results for input(s): LIPASE, AMYLASE in the last 168 hours. No results for input(s): AMMONIA in the last 168 hours. Coagulation Profile: No results for input(s): INR, PROTIME in the last 168 hours. Cardiac Enzymes: No results for input(s): CKTOTAL, CKMB, CKMBINDEX,  TROPONINI in the last 168 hours. BNP (last 3 results) No results for input(s): PROBNP in the last 8760 hours. HbA1C: No results for input(s): HGBA1C in the last 72 hours. CBG: No results for input(s): GLUCAP in the last 168 hours. Lipid Profile: No results for input(s): CHOL, HDL, LDLCALC, TRIG, CHOLHDL, LDLDIRECT in the last 72 hours. Thyroid Function Tests: No results for input(s): TSH, T4TOTAL, FREET4, T3FREE, THYROIDAB in the last 72 hours. Anemia Panel: No results for input(s): VITAMINB12, FOLATE, FERRITIN, TIBC, IRON , RETICCTPCT in the last 72 hours. Urine analysis:    Component Value Date/Time   COLORURINE AMBER (A) 07/07/2021 1402   APPEARANCEUR CLOUDY (A) 07/07/2021 1402   LABSPEC 1.027 07/07/2021 1402   PHURINE 5.0 07/07/2021 1402   GLUCOSEU NEGATIVE 07/07/2021 1402   HGBUR NEGATIVE 07/07/2021 1402   BILIRUBINUR NEGATIVE 07/07/2021 1402   KETONESUR 80 (A) 07/07/2021 1402   PROTEINUR NEGATIVE 07/07/2021 1402   NITRITE NEGATIVE 07/07/2021 1402   LEUKOCYTESUR NEGATIVE 07/07/2021 1402    Radiological Exams on Admission: DG Chest 2 View Result Date: 12/19/2023 CLINICAL DATA:  Cough. EXAM: CHEST - 2 VIEW COMPARISON:  Chest radiograph dated 10/26/2023.  FINDINGS: Right-sided PICC with tip at the cavoatrial junction. Small left pleural effusion, new or increased in size since the prior radiograph. Left upper lobe cavitary lesion again noted. Scattered nodular densities in the right lung and diffuse left lung opacities most consistent with pneumonia. Stable cardiac silhouette. No pneumothorax. No acute osseous pathology. IMPRESSION: 1. Small left pleural effusion, new or increased in size since the prior radiograph. 2. Left upper lobe cavitary lesion and bilateral pneumonia. Electronically Signed   By: Vanetta Chou M.D.   On: 12/19/2023 14:21     Assessment/Plan Principal Problem:   Multifocal pneumonia Active Problems:   HCAP (healthcare-associated pneumonia)   Leucocytosis   Hodgkin's lymphoma (HCC)   Iron  deficiency anemia   Multifocal pneumonia/healthcare associated pneumonia: Presented with cough, dyspnea, fatigue.  Chest x-ray showed small left pleural effusion, new or increased in size since the prior radiograph,left upper lobe cavitary lesion and bilateral pneumonia.  Unclear etiology.  Patient says her symptoms started after bronchoscopy.  BAL/ bacterial/fungal/AFB, Aspergillus, pneumocystis, toxoplasma, cryptococcus, QuantiFERON et)  have been negative.  Cytology showed benign reactive changes Pulmonology following.  Continue vancomycin  and cefepime  for now.  Follow-up cultures.  Will get a sputum culture, respiratory viral panel. She is currently on room air.  Follow-up blood cultures.  Has severe leukocytosis but afebrile.  History of Hodgkin's lymphoma: Currently on nonconventional treatment.  [facility].  Patient does not want to discuss about this problem.  She was diagnosed with axillary lymph node biopsy in the past.  Not interested in treatment/care with oncology.  Chronic iron  deficiency anemia: Takes iron  supplements.  Currently hemoglobin is stable in the range of 8.  Does not need any transfusion  today.  Hypokalemia: Supplemented with  potassium        Severity of Illness: The appropriate patient status for this patient is INPATIENT. Inpatient status is judged to be reasonable and necessary in order to provide the required intensity of service to ensure the patient's safety. The patient's presenting symptoms, physical exam findings, and initial radiographic and laboratory data in the context of their chronic comorbidities is felt to place them at high risk for further clinical deterioration. Furthermore, it is not anticipated that the patient will be medically stable for discharge from the hospital within 2 midnights of admission.   * I  certify that at the point of admission it is my clinical judgment that the patient will require inpatient hospital care spanning beyond 2 midnights from the point of admission due to high intensity of service, high risk for further deterioration and high frequency of surveillance required.*   DVT prophylaxis: Lovenox  Code Status: Full code Family Communication: Discussed with sisters at bedside Consults called: Pulmonology     Ivonne Mustache MD [facility]  12/19/2023, 3:07 PM       [1]  Allergies Allergen Reactions   Azithromycin Anaphylaxis and Itching    Rash, crawly skin, could not form sentences Other reaction(s): Confusion   Penicillins Hives and Itching    Confusion Other reaction(s): Confusion ALL CILLIN FAMILY*

## 2023-12-19 NOTE — ED Notes (Signed)
 Pt declined respiratory swab.

## 2023-12-20 LAB — CBC WITH DIFFERENTIAL/PLATELET
Abs Immature Granulocytes: 0.52 K/uL — ABNORMAL HIGH (ref 0.00–0.07)
Basophils Absolute: 0.1 K/uL (ref 0.0–0.1)
Basophils Relative: 1 %
Eosinophils Absolute: 3.4 K/uL — ABNORMAL HIGH (ref 0.0–0.5)
Eosinophils Relative: 12 %
HCT: 26 % — ABNORMAL LOW (ref 36.0–46.0)
Hemoglobin: 8.1 g/dL — ABNORMAL LOW (ref 12.0–15.0)
Immature Granulocytes: 2 %
Lymphocytes Relative: 4 %
Lymphs Abs: 1.2 K/uL (ref 0.7–4.0)
MCH: 26.2 pg (ref 26.0–34.0)
MCHC: 31.2 g/dL (ref 30.0–36.0)
MCV: 84.1 fL (ref 80.0–100.0)
Monocytes Absolute: 2.5 K/uL — ABNORMAL HIGH (ref 0.1–1.0)
Monocytes Relative: 9 %
Neutro Abs: 21 K/uL — ABNORMAL HIGH (ref 1.7–7.7)
Neutrophils Relative %: 72 %
Platelets: 373 K/uL (ref 150–400)
RBC: 3.09 MIL/uL — ABNORMAL LOW (ref 3.87–5.11)
RDW: 19.3 % — ABNORMAL HIGH (ref 11.5–15.5)
Smear Review: NORMAL
WBC: 28.8 K/uL — ABNORMAL HIGH (ref 4.0–10.5)
nRBC: 0.1 % (ref 0.0–0.2)

## 2023-12-20 LAB — GASTROINTESTINAL PANEL BY PCR, STOOL (REPLACES STOOL CULTURE)

## 2023-12-20 LAB — CBC
HCT: 21.9 % — ABNORMAL LOW (ref 36.0–46.0)
Hemoglobin: 6.5 g/dL — CL (ref 12.0–15.0)
MCH: 25.2 pg — ABNORMAL LOW (ref 26.0–34.0)
MCHC: 29.7 g/dL — ABNORMAL LOW (ref 30.0–36.0)
MCV: 84.9 fL (ref 80.0–100.0)
Platelets: 337 K/uL (ref 150–400)
RBC: 2.58 MIL/uL — ABNORMAL LOW (ref 3.87–5.11)
RDW: 21 % — ABNORMAL HIGH (ref 11.5–15.5)
WBC: 29.4 K/uL — ABNORMAL HIGH (ref 4.0–10.5)
nRBC: 0 % (ref 0.0–0.2)

## 2023-12-20 LAB — BASIC METABOLIC PANEL WITH GFR
Anion gap: 9 (ref 5–15)
BUN: 7 mg/dL (ref 6–20)
CO2: 28 mmol/L (ref 22–32)
Calcium: 9.9 mg/dL (ref 8.9–10.3)
Chloride: 101 mmol/L (ref 98–111)
Creatinine, Ser: 0.44 mg/dL (ref 0.44–1.00)
GFR, Estimated: >60 mL/min (ref >60)
Glucose, Bld: 94 mg/dL (ref 70–99)
Potassium: 3.1 mmol/L — ABNORMAL LOW (ref 3.5–5.1)
Sodium: 138 mmol/L (ref 135–145)

## 2023-12-20 LAB — PREPARE RBC (CROSSMATCH)

## 2023-12-20 LAB — MRSA NEXT GEN BY PCR, NASAL: MRSA by PCR Next Gen: NOT DETECTED

## 2023-12-20 MED ORDER — DIPHENHYDRAMINE HCL 50 MG/ML IJ SOLN
25.0000 mg | Freq: Every evening | INTRAMUSCULAR | Status: DC | PRN
  Administered 2023-12-20 – 2023-12-21 (×2): 25 mg via INTRAVENOUS
  Filled 2023-12-20 (×2): qty 1

## 2023-12-20 MED ORDER — SODIUM CHLORIDE 0.9% IV SOLUTION: Freq: Once | INTRAVENOUS | Status: AC

## 2023-12-20 MED ORDER — DIPHENHYDRAMINE HCL 25 MG PO CAPS
25.0000 mg | ORAL_CAPSULE | Freq: Four times a day (QID) | ORAL | Status: DC | PRN
  Administered 2023-12-21: 06:00:00 25 mg via ORAL
  Filled 2023-12-20 (×4): qty 1

## 2023-12-20 MED ORDER — POTASSIUM CHLORIDE CRYS ER 20 MEQ PO TBCR
40.0000 meq | EXTENDED_RELEASE_TABLET | Freq: Once | ORAL | Status: AC
  Administered 2023-12-20: 09:00:00 40 meq via ORAL
  Filled 2023-12-20: qty 2

## 2023-12-20 NOTE — Plan of Care (Signed)
  Problem: Health Behavior/Discharge Planning: Goal: Ability to manage health-related needs will improve Outcome: Progressing   Problem: Clinical Measurements: Goal: Ability to maintain clinical measurements within normal limits will improve Outcome: Progressing   Problem: Clinical Measurements: Goal: Will remain free from infection Outcome: Progressing   Problem: Pain Managment: Goal: General experience of comfort will improve and/or be controlled Outcome: Progressing

## 2023-12-20 NOTE — Consult Note (Signed)
 WOC Nurse Consult Note: Reason for Consult: discuss wound care with patient and sister  Patient is receiving some type of therapy for her lymphoma; one of the side effects is severe itching. She and sister report that the areas come up and she scratches at them, they scab and the patient picks at them in her sleep.  Wound type: Infectious; full thickness Side effect from current therapy; full thickness Pressure Injury POA:NA Measurement: multiple lesions over the breast and axilla  Wound azi:bzoont slough and black/grey non viable tissue Drainage (amount, consistency, odor) see nursing flow sheets; patient and sister report moderate amounts; no odor  Periwound: intact  Dressing procedure/placement/frequency: Discussed POC that we are currently using and rationale for use.  Apparently the main issue for the patient is the drainage and the non intentional/self inflicted contamination and disruption of the lesions.   For that reason I will change the dressings to silver hydrofiber (for the antimicrobial coverage and exudate management). She can top this with ABD pads or use peri-pads (I have demonstrated the use of these as well).  Sister has taken a picture of the silver dressing so that they can obtain outpatient for use.  Provided the sister with additional mesh underwear because this helps with holding the dressings as well as limiting the patient's access to the dressings and lesions while sleeping.  Discussed the option of our non-restraint mittens and demonstrated use of this will patient, allowing patient to put on her hand.  They are happy with this plan and will use these at  home as well.    Ok to leave dressings in place for 2 days, but to evaluated based on drainage and strike through.  Ok to remove all dressings and shower with water  running over wounds.   OTC antihistamine for itching.   Updated bedside nursing and MD.   Delaine Skates MSN,RN,CWOCN, CNS, CWON-AP [phone]

## 2023-12-20 NOTE — Consult Note (Signed)
 WOC Nurse Consult Note: Reason for Consult: Meet with patient and sister to discuss/teach wound care for breast wounds, discussed case with Dr. Madelyne prior to visit. Unclear etiology; not notes that mention wounds prior to this admission.   Notified by nursing after MD visit, no further needs for wound care at this time.  Will not consult for this reason.   Re consult if needed, will not follow at this time. Thanks  [employer], RN,CWOCN, CNS, [employer] [phone]

## 2023-12-20 NOTE — Progress Notes (Addendum)
 PROGRESS NOTE    Briana Pham  FMW:[id number] DOB: 1985-09-07 DOA: 12/19/2023 PCP: Ambrosio Elouise HERO, MD   Brief Narrative: 38 year old with past medical history significant for anxiety, asthma, Hodgkin lymphoma, chronic iron  deficiency anemia, presents for further evaluation of low hemoglobin sent by PCP.  Patient reports fatigue, cough, tachycardia and diarrhea for 1 month.  She was seen in the ED on 10/26/2023 for further evaluation of left upper lobe cavitary lesion found to have leukocytosis.  She was seen by ID and pulmonologist at that time.  Pulmonologist recommended outpatient bronchoscopy.  Subsequently had bronchoscopy done by Dr. Osborn 10/24.  Specimen sent for bacterial fungal AFB Aspergillus pneumocystis, toxoplasma, cryptococcus, QuantiFERON most of this test have been negative.  After bronchoscopy she developed cough and shortness of breath and tachycardia. Evaluation in the ED hemoglobin 8, white blood cell 38, chest x-ray small left pleural effusion, new or increased in size since prior radiograph, left upper lobe cavitary lesion and bilateral pneumonia.   Assessment & Plan:   Principal Problem:   Multifocal pneumonia Active Problems:   HCAP (healthcare-associated pneumonia)   Leucocytosis   Hodgkin's lymphoma (HCC)   Iron  deficiency anemia   1-Multifocal pneumonia, healthcare associated pneumonia: Patient presented with cough, dyspnea, chest x-ray showed small left pleural effusion left upper lobe cavitary lesion and bilateral pneumonia. -Patient is status post bronchoscopy 10/24 most of the course has been negative. -Continue IV antibiotic - Pulmonologist consulted, recommend tx for PNA - Culture from respiratory bronchoalveolar lavage: Rare Enterobacter cloacae rare Klebsiella pneumoniae - QuantiFERON TB Gold indeterminate. Continue with IV antibiotics, WBC trending down to 38---29  History of Hodgkin lymphoma: Currently on nonconventional  treatment.  She follows with [facility]   Anemia symptomatic iron  deficiency: - Iron  22, ferritin 7500 - Hemoglobin down to 6.5 this morning, plan to proceed with blood transfusion Getting one unit PRBC>  Ferritin elevated, will need follow up on ferritin level prior to IV iron  infusion.  Also unable to give IV iron , due active infection.   Hypokalemia: Replete orally.    Left Breast  Wound:  Patient has had swelling and drainage from left breast for last 2 weeks. She has been getting infusion left lymph node axilla area , since then she has develops swelling left breast, small nodule, she has scratch small nodule eschar at night because of itching. She has been having drainage. Her treatments are on hold for 2 weeks because of this. Report swelling of left breast has decreased.  -Recommend Imagine left breast, ruled out abscess patient would like to hold for now on imagine.  -IV antibiotics would cover for infection left breast as well.  -continue with dressing changes.  -Wound care will follow up with patient today.      Estimated body mass index is 26.63 kg/m as calculated from the following:   Height as of this encounter: 5' 7 (1.702 m).   Weight as of this encounter: 77.1 kg.   DVT prophylaxis: SCD Code Status: Full code Family Communication: acre discussed with sister and significant other.  Disposition Plan:  Status is: Inpatient Remains inpatient appropriate because: management of PNA, and infection.     Consultants:  Pulmonologist   Procedures:  none  Antimicrobials:    Subjective: She is alert, conversant, report cough has improved.  See conversation about breast wound.   Objective: Vitals:   12/19/23 2253 12/20/23 0002 12/20/23 0305 12/20/23 0633  BP: 111/73 115/62 106/68 116/75  Pulse: (!) 117 (!) 107 93  96  Resp: (!) 30 (!) 22 18 16   Temp: 99.6 F (37.6 C) 98.1 F (36.7 C) 97.9 F (36.6 C) 97.7 F (36.5 C)  TempSrc:      SpO2: 95%  97% 100% 100%  Weight:      Height:        Intake/Output Summary (Last 24 hours) at 12/20/2023 0746 Last data filed at 12/19/2023 1745 Gross per 24 hour  Intake 2600 ml  Output --  Net 2600 ml   Filed Weights   12/19/23 1242  Weight: 77.1 kg    Examination:  General exam: Appears calm and comfortable  Respiratory system: Clear to auscultation. Respiratory effort normal. Cardiovascular system: S1 & S2 heard, RRR. No JVD, murmurs, rubs, gallops or clicks. No pedal edema. Gastrointestinal system: Abdomen is nondistended, soft and nontender. No organomegaly or masses felt. Normal bowel sounds heard. Central nervous system: Alert and oriented. No focal neurological deficits. Extremities: Symmetric 5 x 5 power. Skin: left breast is swollen, not tender has some indurated area. Multiples open wound, drainage yellowish fluids.  I palpated left breast has some indurated area, was not tender.   Data Reviewed: I have personally reviewed following labs and imaging studies  CBC: Recent Labs  Lab 12/19/23 1250 12/20/23 0400  WBC 38.2* 29.4*  NEUTROABS 31.0*  --   HGB 8.0* 6.5*  HCT 27.1* 21.9*  MCV 85.2 84.9  PLT 476* 337   Basic Metabolic Panel: Recent Labs  Lab 12/19/23 1250 12/20/23 0400  NA 136 138  K 3.1* 3.1*  CL 97* 101  CO2 28 28  GLUCOSE 99 94  BUN 7 7  CREATININE 0.47 0.44  CALCIUM  10.4* 9.9   GFR: Estimated Creatinine Clearance: 100.1 mL/min (by C-G formula based on SCr of 0.44 mg/dL). Liver Function Tests: Recent Labs  Lab 12/19/23 1250  AST 30  ALT 16  ALKPHOS 375*  BILITOT 0.9  PROT 6.6  ALBUMIN 2.4*   No results for input(s): LIPASE, AMYLASE in the last 168 hours. No results for input(s): AMMONIA in the last 168 hours. Coagulation Profile: No results for input(s): INR, PROTIME in the last 168 hours. Cardiac Enzymes: No results for input(s): CKTOTAL, CKMB, CKMBINDEX, TROPONINI in the last 168 hours. BNP (last 3 results) No  results for input(s): PROBNP in the last 8760 hours. HbA1C: No results for input(s): HGBA1C in the last 72 hours. CBG: No results for input(s): GLUCAP in the last 168 hours. Lipid Profile: No results for input(s): CHOL, HDL, LDLCALC, TRIG, CHOLHDL, LDLDIRECT in the last 72 hours. Thyroid Function Tests: No results for input(s): TSH, T4TOTAL, FREET4, T3FREE, THYROIDAB in the last 72 hours. Anemia Panel: Recent Labs    12/19/23 2130  FERRITIN >7,500*  TIBC NOT CALCULATED  IRON  22*   Sepsis Labs: Recent Labs  Lab 12/19/23 1446  LATICACIDVEN 1.7    Recent Results (from the past 240 hours)  C Difficile Quick Screen w PCR reflex     Status: None   Collection Time: 12/19/23  2:40 PM   Specimen: Peripheral; Stool  Result Value Ref Range Status   C Diff antigen NEGATIVE NEGATIVE Final   C Diff toxin NEGATIVE NEGATIVE Final   C Diff interpretation No C. difficile detected.  Final    Comment: Performed at [facility], [address] [place], [place] [address]  MRSA Next Gen by PCR, Nasal     Status: None   Collection Time: 12/20/23  1:13 AM   Specimen: Nasal Mucosa;  Nasal Swab  Result Value Ref Range Status   MRSA by PCR Next Gen NOT DETECTED NOT DETECTED Final    Comment: (NOTE) The GeneXpert MRSA Assay (FDA approved for NASAL specimens only), is one component of a comprehensive MRSA colonization surveillance program. It is not intended to diagnose MRSA infection nor to guide or monitor treatment for MRSA infections. Test performance is not FDA approved in patients less than 69 years old. Performed at [facility], [address], [place], [place] [address]          Radiology Studies: DG Chest 2 View Result Date: 12/19/2023 CLINICAL DATA:  Cough. EXAM: CHEST - 2 VIEW COMPARISON:  Chest radiograph dated 10/26/2023. FINDINGS: Right-sided PICC with tip at the cavoatrial junction. Small left pleural  effusion, new or increased in size since the prior radiograph. Left upper lobe cavitary lesion again noted. Scattered nodular densities in the right lung and diffuse left lung opacities most consistent with pneumonia. Stable cardiac silhouette. No pneumothorax. No acute osseous pathology. IMPRESSION: 1. Small left pleural effusion, new or increased in size since the prior radiograph. 2. Left upper lobe cavitary lesion and bilateral pneumonia. Electronically Signed   By: Vanetta Chou M.D.   On: 12/19/2023 14:21        Scheduled Meds:  sodium chloride    Intravenous Once   Chlorhexidine  Gluconate Cloth  6 each Topical Daily   enoxaparin  (LOVENOX ) injection  40 mg Subcutaneous Q24H   ferrous sulfate   325 mg Oral Q breakfast   potassium chloride   40 mEq Oral Once   sodium chloride  flush  10-40 mL Intracatheter Q12H   Continuous Infusions:  ceFEPime  (MAXIPIME ) IV 2 g (12/20/23 0044)   lactated ringers  100 mL/hr at 12/19/23 2050   vancomycin  1,000 mg (12/20/23 0535)     LOS: 1 day    Time spent: 35 Minutes    Ascencion Coye A Erico Stan, MD [facility]   If 7PM-7AM, please contact night-coverage [email or web address]  12/20/2023, 7:46 AM

## 2023-12-20 NOTE — Progress Notes (Signed)
°   12/20/23 0912  TOC Brief Assessment  Insurance and Status Reviewed  Patient has primary care physician Yes  Home environment has been reviewed single family home  Prior level of function: independent  Prior/Current Home Services No current home services  Social Drivers of Health Review SDOH reviewed no interventions necessary  Readmission risk has been reviewed Yes  Transition of care needs no transition of care needs at this time    Signed: Heather Saltness, MSW, LCSW Clinical Social Worker Inpatient Care Management 12/20/2023 9:12 AM

## 2023-12-21 ENCOUNTER — Inpatient Hospital Stay (HOSPITAL_COMMUNITY)

## 2023-12-21 ENCOUNTER — Telehealth: Payer: Self-pay

## 2023-12-21 LAB — BPAM RBC
Blood Product Expiration Date: 202601122359
Blood Product Expiration Date: 202601132359
Blood Product Expiration Date: 202601132359
ISSUE DATE / TIME: 202512160922
ISSUE DATE / TIME: 202512161437
ISSUE DATE / TIME: 202512170639
Unit Type and Rh: 5100
Unit Type and Rh: 5100
Unit Type and Rh: 5100

## 2023-12-21 LAB — CBC
HCT: 26.5 % — ABNORMAL LOW (ref 36.0–46.0)
Hemoglobin: 8.1 g/dL — ABNORMAL LOW (ref 12.0–15.0)
MCH: 26.1 pg (ref 26.0–34.0)
MCHC: 30.6 g/dL (ref 30.0–36.0)
MCV: 85.5 fL (ref 80.0–100.0)
Platelets: 443 K/uL — ABNORMAL HIGH (ref 150–400)
RBC: 3.1 MIL/uL — ABNORMAL LOW (ref 3.87–5.11)
RDW: 19.8 % — ABNORMAL HIGH (ref 11.5–15.5)
WBC: 30.4 K/uL — ABNORMAL HIGH (ref 4.0–10.5)
nRBC: 0 % (ref 0.0–0.2)

## 2023-12-21 LAB — TYPE AND SCREEN
ABO/RH(D): O POS
Antibody Screen: POSITIVE
Unit division: 0
Unit division: 0
Unit division: 0

## 2023-12-21 LAB — BASIC METABOLIC PANEL WITH GFR
Anion gap: 10 (ref 5–15)
BUN: 7 mg/dL (ref 6–20)
CO2: 26 mmol/L (ref 22–32)
Calcium: 10.5 mg/dL — ABNORMAL HIGH (ref 8.9–10.3)
Chloride: 101 mmol/L (ref 98–111)
Creatinine, Ser: 0.51 mg/dL (ref 0.44–1.00)
GFR, Estimated: >60 mL/min (ref >60)
Glucose, Bld: 99 mg/dL (ref 70–99)
Potassium: 3.4 mmol/L — ABNORMAL LOW (ref 3.5–5.1)
Sodium: 137 mmol/L (ref 135–145)

## 2023-12-21 MED ORDER — LEVOTHYROXINE SODIUM 75 MCG PO TABS: 37.5000 ug | ORAL_TABLET | Freq: Every day | ORAL | Status: DC

## 2023-12-21 MED ORDER — LEVOFLOXACIN 750 MG PO TABS: 750.0000 mg | ORAL_TABLET | Freq: Every day | ORAL | Status: DC

## 2023-12-21 MED ORDER — METRONIDAZOLE 500 MG PO TABS: 500.0000 mg | ORAL_TABLET | Freq: Two times a day (BID) | ORAL | Status: DC

## 2023-12-21 MED ORDER — LIOTHYRONINE SODIUM 5 MCG PO TABS: 20.0000 ug | ORAL_TABLET | Freq: Every morning | ORAL | Status: DC

## 2023-12-21 MED ORDER — HEPARIN SOD (PORK) LOCK FLUSH 100 UNIT/ML IV SOLN
250.0000 [IU] | INTRAVENOUS | Status: AC | PRN
  Administered 2023-12-21: 18:00:00 250 [IU]
  Filled 2023-12-21: qty 3

## 2023-12-21 MED ORDER — POTASSIUM CHLORIDE CRYS ER 20 MEQ PO TBCR
40.0000 meq | EXTENDED_RELEASE_TABLET | Freq: Once | ORAL | Status: AC
  Administered 2023-12-21: 12:00:00 40 meq via ORAL
  Filled 2023-12-21: qty 2

## 2023-12-21 MED ORDER — LEVOFLOXACIN 750 MG PO TABS: 750.0000 mg | ORAL_TABLET | Freq: Every day | ORAL | 0 refills | Status: AC

## 2023-12-21 MED ORDER — POTASSIUM CHLORIDE CRYS ER 20 MEQ PO TBCR: 20.0000 meq | EXTENDED_RELEASE_TABLET | Freq: Every day | ORAL | Status: DC

## 2023-12-21 MED ORDER — PROGESTERONE MICRONIZED 100 MG PO CAPS: 200.0000 mg | ORAL_CAPSULE | Freq: Every day | ORAL | Status: DC

## 2023-12-21 MED ORDER — TOPIRAMATE 25 MG PO TABS: 25.0000 mg | ORAL_TABLET | Freq: Two times a day (BID) | ORAL | Status: DC | PRN

## 2023-12-21 NOTE — Plan of Care (Signed)
  Problem: Clinical Measurements: Goal: Ability to maintain clinical measurements within normal limits will improve Outcome: Progressing Goal: Diagnostic test results will improve Outcome: Progressing Goal: Respiratory complications will improve Outcome: Progressing Goal: Cardiovascular complication will be avoided Outcome: Progressing   Problem: Skin Integrity: Goal: Risk for impaired skin integrity will decrease Outcome: Progressing

## 2023-12-21 NOTE — Progress Notes (Incomplete)
 PROGRESS NOTE    Briana Pham  FMW:[id number] DOB: 1985-04-08 DOA: 12/19/2023 PCP: Ambrosio Elouise HERO, MD   Brief Narrative: 38 year old with past medical history significant for anxiety, asthma, Hodgkin lymphoma, chronic iron  deficiency anemia, presents for further evaluation of low hemoglobin sent by PCP.  Patient reports fatigue, cough, tachycardia and diarrhea for 1 month.  She was seen in the ED on 10/26/2023 for further evaluation of left upper lobe cavitary lesion found to have leukocytosis.  She was seen by ID and pulmonologist at that time.  Pulmonologist recommended outpatient bronchoscopy.  Subsequently had bronchoscopy done by Dr. Osborn 10/24.  Specimen sent for bacterial fungal AFB Aspergillus pneumocystis, toxoplasma, cryptococcus, QuantiFERON most of this test have been negative.  After bronchoscopy she developed cough and shortness of breath and tachycardia. Evaluation in the ED hemoglobin 8, white blood cell 38, chest x-ray small left pleural effusion, new or increased in size since prior radiograph, left upper lobe cavitary lesion and bilateral pneumonia.   Assessment & Plan:   Principal Problem:   Multifocal pneumonia Active Problems:   HCAP (healthcare-associated pneumonia)   Leucocytosis   Hodgkin's lymphoma (HCC)   Iron  deficiency anemia   1-Multifocal pneumonia, healthcare associated pneumonia: Patient presented with cough, dyspnea, chest x-ray showed small left pleural effusion left upper lobe cavitary lesion and bilateral pneumonia. -Patient is status post bronchoscopy 10/24 most of the course has been negative. -Continue IV antibiotic - Pulmonologist consulted, recommend tx for PNA - Culture from respiratory bronchoalveolar lavage: Rare Enterobacter cloacae rare Klebsiella pneumoniae - QuantiFERON TB Gold indeterminate. Continue with IV antibiotics, WBC trending down to 38---29  History of Hodgkin lymphoma: Currently on nonconventional  treatment.  [facility]   Anemia symptomatic iron  deficiency: - Iron  22, ferritin 7500 - Hemoglobin down to 6.5 this morning, plan to proceed with blood transfusion Getting one unit PRBC>  Ferritin elevated, will need follow up on ferritin level prior to IV iron  infusion.  Also unable to give IV iron , due active infection.   Hypokalemia: Replete orally.    Left Breast  Wound:  Patient has had swelling and drainage from left breast for last 2 weeks. She has been getting infusion left lymph node axilla area , since then she has develops swelling left breast, small nodule, she has scratch small nodule eschar at night because of itching. She has been having drainage. Her treatments are on hold for 2 weeks because of this. Report swelling of left breast has decreased.  -Recommend Imagine left breast, ruled out abscess patient would like to hold for now on imagine.  -IV antibiotics would cover for infection left breast as well.  -continue with dressing changes.  -Wound care will follow up with patient today.      Estimated body mass index is 26.63 kg/m as calculated from the following:   Height as of this encounter: 5' 7 (1.702 m).   Weight as of this encounter: 77.1 kg.   DVT prophylaxis: SCD Code Status: Full code Family Communication: acre discussed with sister and significant other.  Disposition Plan:  Status is: Inpatient Remains inpatient appropriate because: management of PNA, and infection.     Consultants:  Pulmonologist   Procedures:  none  Antimicrobials:    Subjective: She is alert, conversant, report cough has improved.  See conversation about breast wound.   Objective: Vitals:   12/20/23 1230 12/20/23 1404 12/20/23 1927 12/21/23 0556  BP: 118/80 117/75 123/76 137/84  Pulse: (!) 103 (!) 109  ROLLEN)  109  Resp: 16 19 18 20   Temp: 97.6 F (36.4 C) 97.8 F (36.6 C) 98.6 F (37 C) 99.3 F (37.4 C)  TempSrc: Oral  Oral   SpO2:  100% 100%  97%  Weight:      Height:        Intake/Output Summary (Last 24 hours) at 12/21/2023 0818 Last data filed at 12/21/2023 0600 Gross per 24 hour  Intake 1421.79 ml  Output --  Net 1421.79 ml   Filed Weights   12/19/23 1242  Weight: 77.1 kg    Examination:  General exam: Appears calm and comfortable  Respiratory system: Clear to auscultation. Respiratory effort normal. Cardiovascular system: S1 & S2 heard, RRR. No JVD, murmurs, rubs, gallops or clicks. No pedal edema. Gastrointestinal system: Abdomen is nondistended, soft and nontender. No organomegaly or masses felt. Normal bowel sounds heard. Central nervous system: Alert and oriented. No focal neurological deficits. Extremities: Symmetric 5 x 5 power. Skin: left breast is swollen, not tender has some indurated area. Multiples open wound, drainage yellowish fluids.  I palpated left breast has some indurated area, was not tender.   Data Reviewed: I have personally reviewed following labs and imaging studies  CBC: Recent Labs  Lab 12/19/23 1250 12/20/23 0400 12/20/23 1633  WBC 38.2* 29.4* 28.8*  NEUTROABS 31.0*  --  21.0*  HGB 8.0* 6.5* 8.1*  HCT 27.1* 21.9* 26.0*  MCV 85.2 84.9 84.1  PLT 476* 337 373   Basic Metabolic Panel: Recent Labs  Lab 12/19/23 1250 12/20/23 0400  NA 136 138  K 3.1* 3.1*  CL 97* 101  CO2 28 28  GLUCOSE 99 94  BUN 7 7  CREATININE 0.47 0.44  CALCIUM  10.4* 9.9   GFR: Estimated Creatinine Clearance: 100.1 mL/min (by C-G formula based on SCr of 0.44 mg/dL). Liver Function Tests: Recent Labs  Lab 12/19/23 1250  AST 30  ALT 16  ALKPHOS 375*  BILITOT 0.9  PROT 6.6  ALBUMIN 2.4*   No results for input(s): LIPASE, AMYLASE in the last 168 hours. No results for input(s): AMMONIA in the last 168 hours. Coagulation Profile: No results for input(s): INR, PROTIME in the last 168 hours. Cardiac Enzymes: No results for input(s): CKTOTAL, CKMB, CKMBINDEX,  TROPONINI in the last 168 hours. BNP (last 3 results) No results for input(s): PROBNP in the last 8760 hours. HbA1C: No results for input(s): HGBA1C in the last 72 hours. CBG: No results for input(s): GLUCAP in the last 168 hours. Lipid Profile: No results for input(s): CHOL, HDL, LDLCALC, TRIG, CHOLHDL, LDLDIRECT in the last 72 hours. Thyroid Function Tests: No results for input(s): TSH, T4TOTAL, FREET4, T3FREE, THYROIDAB in the last 72 hours. Anemia Panel: Recent Labs    12/19/23 2130  FERRITIN >7,500*  TIBC NOT CALCULATED  IRON  22*   Sepsis Labs: Recent Labs  Lab 12/19/23 1446  LATICACIDVEN 1.7    Recent Results (from the past 240 hours)  Gastrointestinal Panel by PCR , Stool     Status: None   Collection Time: 12/19/23  2:40 PM   Specimen: Peripheral; Stool  Result Value Ref Range Status   Campylobacter species NOT DETECTED NOT DETECTED Final   Plesimonas shigelloides NOT DETECTED NOT DETECTED Final   Salmonella species NOT DETECTED NOT DETECTED Final   Yersinia enterocolitica NOT DETECTED NOT DETECTED Final   Vibrio species NOT DETECTED NOT DETECTED Final   Vibrio cholerae NOT DETECTED NOT DETECTED Final   Enteroaggregative E coli (EAEC) NOT DETECTED NOT DETECTED Final  Enteropathogenic E coli (EPEC) NOT DETECTED NOT DETECTED Final   Enterotoxigenic E coli (ETEC) NOT DETECTED NOT DETECTED Final   Shiga like toxin producing E coli (STEC) NOT DETECTED NOT DETECTED Final   Shigella/Enteroinvasive E coli (EIEC) NOT DETECTED NOT DETECTED Final   Cryptosporidium NOT DETECTED NOT DETECTED Final   Cyclospora cayetanensis NOT DETECTED NOT DETECTED Final   Entamoeba histolytica NOT DETECTED NOT DETECTED Final   Giardia lamblia NOT DETECTED NOT DETECTED Final   Adenovirus F40/41 NOT DETECTED NOT DETECTED Final   Astrovirus NOT DETECTED NOT DETECTED Final   Norovirus GI/GII NOT DETECTED NOT DETECTED Final   Rotavirus A NOT DETECTED NOT DETECTED  Final   Sapovirus (I, II, IV, and V) NOT DETECTED NOT DETECTED Final    Comment: Performed at [facility] [address] [place], [place] [address]  C Difficile Quick Screen w PCR reflex     Status: None   Collection Time: 12/19/23  2:40 PM   Specimen: Peripheral; Stool  Result Value Ref Range Status   C Diff antigen NEGATIVE NEGATIVE Final   C Diff toxin NEGATIVE NEGATIVE Final   C Diff interpretation No C. difficile detected.  Final    Comment: Performed at [facility], [address], [place], [place] [address]  Blood culture (routine x 2)     Status: None (Preliminary result)   Collection Time: 12/19/23  9:30 PM   Specimen: BLOOD  Result Value Ref Range Status   Specimen Description   Final    BLOOD LEFT ANTECUBITAL Performed at [facility], [address] [place], [place] [address]    Special Requests   Final    BOTTLES DRAWN AEROBIC ONLY Blood Culture adequate volume Performed at [facility], [address] [place], [place] [address]    Culture   Final    NO GROWTH 1 DAY Performed at [facility], [address], [place], [place] [address]    Report Status PENDING  Incomplete  Blood culture (routine x 2)     Status: None (Preliminary result)   Collection Time: 12/19/23  9:30 PM   Specimen: BLOOD  Result Value Ref Range Status   Specimen Description   Final    BLOOD BLOOD LEFT HAND Performed at [facility], [address] [place], [place] [address]    Special Requests   Final    BOTTLES DRAWN AEROBIC ONLY Blood Culture adequate volume Performed at [facility], [address] [place], [place] [address]    Culture   Final    NO GROWTH 1 DAY Performed at [facility], [address], [place], [place] [address]    Report Status PENDING  Incomplete  MRSA Next Gen by PCR, Nasal     Status: None   Collection Time: 12/20/23  1:13 AM   Specimen:  Nasal Mucosa; Nasal Swab  Result Value Ref Range Status   MRSA by PCR Next Gen NOT DETECTED NOT DETECTED Final    Comment: (NOTE) The GeneXpert MRSA Assay (FDA approved for NASAL specimens only), is one component of a comprehensive MRSA colonization surveillance program. It is not intended to diagnose MRSA infection nor to guide or monitor treatment for MRSA infections. Test performance is not FDA approved in patients less than 73 years old. Performed at [facility], [address] [place], [place] [address]          Radiology Studies: DG  Chest 2 View Result Date: 12/19/2023 CLINICAL DATA:  Cough. EXAM: CHEST - 2 VIEW COMPARISON:  Chest radiograph dated 10/26/2023. FINDINGS: Right-sided PICC with tip at the cavoatrial junction. Small left pleural effusion, new or increased in size since the prior radiograph. Left upper lobe cavitary lesion again noted. Scattered nodular densities in the right lung and diffuse left lung opacities most consistent with pneumonia. Stable cardiac silhouette. No pneumothorax. No acute osseous pathology. IMPRESSION: 1. Small left pleural effusion, new or increased in size since the prior radiograph. 2. Left upper lobe cavitary lesion and bilateral pneumonia. Electronically Signed   By: Vanetta Chou M.D.   On: 12/19/2023 14:21        Scheduled Meds:  Chlorhexidine  Gluconate Cloth  6 each Topical Daily   ferrous sulfate   325 mg Oral Q breakfast   sodium chloride  flush  10-40 mL Intracatheter Q12H   Continuous Infusions:  ceFEPime  (MAXIPIME ) IV 2 g (12/20/23 2353)   vancomycin  1,000 mg (12/20/23 2050)     LOS: 2 days    Time spent: 35 Minutes    Karston Hyland A Jadynn Epping, MD [facility]   If 7PM-7AM, please contact night-coverage [email or web address]  12/21/2023, 8:18 AM

## 2023-12-21 NOTE — Discharge Instructions (Signed)
 Begin with next dressing change on night shift (to see if the dressing will maintain better absorption of the drainage).   Cleanse L breast/axillary wounds with Vashe, do not rinse.  Apply silver hydrofiber-Aquacel Ag+ ([place] [phone] other day based on drainage, and secure with ABD pad  May  cut the crotch out of mesh underwear and attempt to use like a tube top to hold dressing in place. Change every other day and as needed for drainage saturation.   12/20/23 1400

## 2023-12-21 NOTE — Consult Note (Addendum)
 NAME:  Briana Pham, MRN:  [id number], DOB:  [birth year], LOS: 2 ADMISSION DATE:  12/19/2023, CONSULTATION DATE:  12/19/23 REFERRING MD:  Jillian , CHIEF COMPLAINT:   abnormal CXR  History of Present Illness:   38 yo F PMH hodgkins lymphoma, unilateral vocal cord paralysis, asthma, who presented to ED 12/19/23 at request of her outpatient team for evaluation of abnormal labs.  In ED she had a CXR which revealed LUL cavitary lesion (not new) and bilat ASD   Pt underwent a bronch 10/2023 and since that time has felt that she has had a persistent cough, though in chart review I also see c/o cough preceding bronch related to her cavitary lung lesion which has been there for months and refractory to multiple abx courses. Bronch w enterobacter and klebsiella but non revealing otherwise  like post bronch cough is mostly dry, at times produces clear/white sputum. Feels VCD makes it more difficult to cough.   Generally she feels poor. States she completed a steroid course about 2 weeks ago and since then has had worse fatigue lethargy brain fog and generalized weakness.  Sister shares that pt breast wounds are worse. Pt says she used too much of a medication (it sounds like she may be on non-traditional Ca tx) and that while usually she gets some wounds from this medication, it is worse this time x about 3 weeks + associated L breast swelling. Wounds are itchy, have creamy exudate. Swelling is severe enough that pt has had to change daily attire, but pt/family feel it is improved some in the last week or so.   Pertinent  Medical History   Hodgkins lymphoma   Significant Hospital Events: Including procedures, antibiotic start and stop dates in addition to other pertinent events   12/15 admit [facility]. PCCM consult for abnormal CXR   Interim History / Subjective:  Starting abx   Objective    Blood pressure 132/86, pulse (!) 109, temperature 98.6 F (37 C), temperature source Oral, resp. rate 20,  height 5' 7 (1.702 m), weight 77.1 kg, SpO2 97%, not currently breastfeeding.        Intake/Output Summary (Last 24 hours) at 12/21/2023 1459 Last data filed at 12/21/2023 1059 Gross per 24 hour  Intake 1083.79 ml  Output --  Net 1083.79 ml   Filed Weights   12/19/23 1242  Weight: 77.1 kg    Examination: General: chronically ill appearing middle aged F NAD  HENT: NCAT  Lungs: even unlabored on RA Chest: L>R breast edema  Extremities: no acute joint deformity  Neuro: AAOx4 Psych: flat affect  GU: defer   Resolved problem list   Assessment and Plan   Possible L pleural effusion  - Initial 2 view CXR 12/15 read as small L pleural effusion, however on lateral view no effusion appreciated on my review. 1 view CXR obtained 12/17 which is read as possible large pleural effusion. Appears similar to prior imaging, though limited by 1 view vs 2.  -in review of prior imaging, looks like there has been a small pleural effusion on PET CT several months ago  P -talked about POCUS eval, but think at this point best path to eval her anatomy is CT chest. D/w pt who is agreeable (she does note that the [facility] has advised her lymph node infusions may render CT imaging less accurate, but I am hopeful that we might still be able to differentiate presence of pleural effusion)  -STAT CT chest ordered -If she  does have an effusion, we talked about possible thora. Pt does hope to dc home and has a work promotion event 12/18 -- as she has no resp sx, if she does have a pleural effusion on CT, it might be reasonable to pursue outpatient thora instead of remaining inpatient. Would send usual pleural studies +cyto.   Bilat PNA Persistent LUL Cavitary PNA vs necrotic malignancy -bronch 10/20205 w klebsiella, enterobacter Unilateral ( L sided) vocal cord paralysis Asthma P -she is on vanc cefepime   -BDs  Hodgkins lymphoma -hypermetabolic adenopathy neck chest abdomen  pelvis -hypermetabolic cavitary mass LUL, scattered pulm nodules variable metabolic activity -hypermetabolic splenic lesions P -follows at the [facility]   Breast wounds, cellulitis  -related to her treatments with the [facility] for her hodgkins lymphoma -WOC consult appreciated  -abx per primary  -wound cx was ordered 12/15 and is pending      Labs   CBC: Recent Labs  Lab 12/19/23 1250 12/20/23 0400 12/20/23 1633 12/21/23 1052  WBC 38.2* 29.4* 28.8* 30.4*  NEUTROABS 31.0*  --  21.0*  --   HGB 8.0* 6.5* 8.1* 8.1*  HCT 27.1* 21.9* 26.0* 26.5*  MCV 85.2 84.9 84.1 85.5  PLT 476* 337 373 443*    Basic Metabolic Panel: Recent Labs  Lab 12/19/23 1250 12/20/23 0400 12/21/23 1052  NA 136 138 137  K 3.1* 3.1* 3.4*  CL 97* 101 101  CO2 28 28 26   GLUCOSE 99 94 99  BUN 7 7 7   CREATININE 0.47 0.44 0.51  CALCIUM  10.4* 9.9 10.5*   GFR: Estimated Creatinine Clearance: 100.1 mL/min (by C-G formula based on SCr of 0.51 mg/dL). Recent Labs  Lab 12/19/23 1250 12/19/23 1446 12/20/23 0400 12/20/23 1633 12/21/23 1052  WBC 38.2*  --  29.4* 28.8* 30.4*  LATICACIDVEN  --  1.7  --   --   --     Liver Function Tests: Recent Labs  Lab 12/19/23 1250  AST 30  ALT 16  ALKPHOS 375*  BILITOT 0.9  PROT 6.6  ALBUMIN 2.4*   No results for input(s): LIPASE, AMYLASE in the last 168 hours. No results for input(s): AMMONIA in the last 168 hours.  ABG No results found for: PHART, PCO2ART, PO2ART, HCO3, TCO2, ACIDBASEDEF, O2SAT   Coagulation Profile: No results for input(s): INR, PROTIME in the last 168 hours.  Cardiac Enzymes: No results for input(s): CKTOTAL, CKMB, CKMBINDEX, TROPONINI in the last 168 hours.  HbA1C: No results found for: HGBA1C  CBG: No results for input(s): GLUCAP in the last 168 hours.   CCT na    Ronnald Gave MSN, AGACNP-BC [facility] Pulmonary/Critical Care Medicine Amion for pager   12/21/2023, 2:59 PM

## 2023-12-21 NOTE — Telephone Encounter (Signed)
 Outpt follow up appnt with Dr Annella requested.

## 2023-12-21 NOTE — Plan of Care (Signed)

## 2023-12-21 NOTE — Discharge Summary (Signed)
 Physician Discharge Summary   [name] MRN: [id number] DOB: 12/24/1985  Admit date:     12/19/2023  Discharge date: 12/21/2023  Discharge Physician: Briana Pham   PCP: Briana Elouise HERO, MD   Recommendations at discharge:    Needs close  follow up with Pulmonologist and ID Recommend follow  up with oncology for evaluation of  lymphoma   Discharge Diagnoses: Principal Problem:   Multifocal pneumonia Active Problems:   HCAP (healthcare-associated pneumonia)   Leucocytosis   Hodgkin's lymphoma (HCC)   Iron  deficiency anemia  Resolved Problems:   * No resolved hospital problems. *  Hospital Course: 38 year old with past medical history significant for anxiety, asthma, Hodgkin lymphoma, chronic iron  deficiency anemia, presents for further evaluation of low hemoglobin sent by PCP.  Patient reports fatigue, cough, tachycardia and diarrhea for 1 month.   She was seen in the ED on 10/26/2023 for further evaluation of left upper lobe cavitary lesion found to have leukocytosis.  She was seen by ID and pulmonologist at that time.  Pulmonologist recommended outpatient bronchoscopy.  Subsequently had bronchoscopy done by Briana Pham 10/24.  Specimen sent for bacterial fungal AFB Aspergillus pneumocystis, toxoplasma, cryptococcus, QuantiFERON most of this test have been negative.  After bronchoscopy she developed cough and shortness of breath and tachycardia. Evaluation in the ED hemoglobin 8, white blood cell 38, chest x-ray small left pleural effusion, new or increased in size since prior radiograph, left upper lobe cavitary lesion and bilateral pneumonia.  Assessment and Plan: 1-Multifocal pneumonia, healthcare associated pneumonia: Patient presented with cough, dyspnea, chest x-ray showed small left pleural effusion left upper lobe cavitary lesion and bilateral pneumonia. -Patient is status post bronchoscopy 10/24 most of the course has been negative. -Continue  IV antibiotic - Pulmonologist consulted, recommend tx for PNA - Culture from respiratory bronchoalveolar lavage: Rare Enterobacter cloacae rare Klebsiella pneumoniae - QuantiFERON TB Gold indeterminate. -Treated  with IV antibiotics, WBC trending down to 38---29  Discussed with pulmonologist, patient will be discharge on levaquin  and flagyl  4 weeks.  Patient wanted to be  discharge today.  She will need further bx.  History of Hodgkin lymphoma: Currently on nonconventional treatment.  [facility]  Advised she will need other form  of treatment for Lymphoma, lung finding could  be also lymphoma, and breast finding. There  is some liver  lesion as well.   Anemia symptomatic iron  deficiency: - Iron  22, ferritin 7500 - Hemoglobin down to 6.5 this morning, plan to proceed with blood transfusion Getting one unit PRBC>  Ferritin elevated, will need follow up on ferritin level prior to IV iron  infusion.  Also unable to give IV iron , due active infection.   Hb stable  Hypokalemia: Replete orally.      Left Breast  Wound:  Patient has had swelling and drainage from left breast for last 2 weeks. She has been getting infusion left lymph node axilla area , since then she has develops swelling left breast, small nodule, she has scratch small nodule eschar at night because of itching. She has been having drainage. Her treatments are on hold for 2 weeks because of this. Report swelling of left breast has decreased.  -Recommend Imagine left breast, ruled out abscess patient would like to hold for now on imagine.  -IV antibiotics would cover for infection left breast as well.  -continue with dressing changes.  -Appreciate wound care  CT chest negative for abscess, showed concern for breast lymphoma. Advised patient  she will  need Bx.          Consultants:Pulmonologist  Procedures performed: none Disposition: Home Diet recommendation:  Regular diet DISCHARGE  MEDICATION: Allergies as of 12/21/2023       Reactions   Azithromycin Anaphylaxis, Itching   Rash, crawly skin, could not form sentences, Confusion   Penicillins Hives, Itching   Confusion. ALL CILLIN FAMILY*        Medication List     STOP taking these medications    celecoxib 200 MG capsule Commonly known as: CELEBREX   diazepam 10 MG tablet Commonly known as: VALIUM   furosemide 40 MG tablet Commonly known as: LASIX   hydrocortisone 10 MG tablet Commonly known as: CORTEF   spironolactone 25 MG tablet Commonly known as: ALDACTONE       TAKE these medications    ipratropium-albuterol  0.5-2.5 (3) MG/3ML Soln Commonly known as: DUONEB Take 3 mLs by nebulization daily as needed (wheezing/sob).   Journavx 50 MG Tabs Generic drug: Suzetrigine Take 50 mg by mouth daily as needed (pain).   ketorolac  10 MG tablet Commonly known as: TORADOL  Take 10 mg by mouth daily as needed for moderate pain (pain score 4-6) or severe pain (pain score 7-10).   levofloxacin  750 MG tablet Commonly known as: LEVAQUIN  Take 1 tablet (750 mg total) by mouth daily.   levothyroxine  25 MCG tablet Commonly known as: SYNTHROID  Take 37.5 mcg by mouth daily.   liothyronine  5 MCG tablet Commonly known as: CYTOMEL  Take 20 mcg by mouth every morning.   metroNIDAZOLE  500 MG tablet Commonly known as: FLAGYL  Take 1 tablet (500 mg total) by mouth every 12 (twelve) hours.   potassium chloride  SA 20 MEQ tablet Commonly known as: KLOR-CON  M Take 20 mEq by mouth daily.   progesterone  200 MG capsule Commonly known as: PROMETRIUM  Take 200 mg by mouth at bedtime.   topiramate  25 MG tablet Commonly known as: TOPAMAX  Take 25 mg by mouth 2 (two) times daily as needed (headaches).               Discharge Care Instructions  (From admission, onward)           Start     Ordered   12/21/23 0000  Discharge wound care:       Comments: See above   12/21/23 1706             Follow-up Information     Dalton-Bethea, Elouise HERO, MD Follow up in 1 week(s).   Specialty: Physical Medicine and Rehabilitation Contact information: [address] [place] [address] [phone]         Briana Bodily, MD Follow up in 3 week(s).   Specialty: Infectious Diseases Contact information: [address] [place] [address] [phone]                Discharge Exam: Fredricka Weights   12/19/23 1242  Weight: 77.1 kg   General; NAD  Condition at discharge: stable  The results of significant diagnostics from this hospitalization (including imaging, microbiology, ancillary and laboratory) are listed below for reference.   Imaging Studies: CT CHEST WO CONTRAST Result Date: 12/21/2023 CLINICAL DATA:  Pleural effusion, malignancy suspected. Hodgkin lymphoma. * Tracking Code: BO * EXAM: CT CHEST WITHOUT CONTRAST TECHNIQUE: Multidetector CT imaging of the chest was performed following the standard protocol without IV contrast. RADIATION DOSE REDUCTION: This exam was performed according to the departmental dose-optimization program which includes automated exposure control, adjustment of the  mA and/or kV according to patient size and/or use of iterative reconstruction technique. COMPARISON:  PET 09/14/2023 and CT chest 09/02/2023. FINDINGS: Cardiovascular: Right PICC tip is at the SVC RA junction. Heart is enlarged. No definite pericardial effusion. Mediastinum/Nodes: Low cervical and thoracic inlet lymph nodes measure up to 12 mm on the left (13). Mediastinal and hilar lymph nodes are difficult to assess without IV contrast. Bulky axillary adenopathy bilaterally with index mass in the left axilla measuring approximately 4.1 x 5.1 cm (2/46), enlarged from 2.6 x 3.5 cm on 09/14/2023. Esophagus is grossly unremarkable. Inferior right breast nodule measures 2.2 cm (2/142). Left breast edema, asymmetric enlargement and skin thickening, new from 09/14/2023.  Lungs/Pleura: Thick-walled cavitary mass with new internal air-fluid level in the left upper lobe measures approximately 6.3 x 7.6 cm, similar in overall size. Additional progressively thick-walled masslike cavitation in the lingula (8/50 7-60). Enlarging bilateral pulmonary nodules with index lateral segment right middle lobe nodule measuring 1.9 x 2.1 cm (8/74), previously 1.2 x 1.3 cm. Small rind of loculated pleural fluid in the lower left hemithorax with areas of probable pleural thickening, specially in the upper left hemithorax. External compression and narrowing involving the left upper and left lower lobe bronchi. Upper Abdomen: There may be a 1.4 x 2.1 cm low-attenuation lesion in the peripheral right hepatic lobe (2/144). Spleen appears enlarged but is incompletely imaged. Small to borderline enlarged upper abdominal lymph nodes. Musculoskeletal: No worrisome lytic or sclerotic lesions. There may be an exostosis along the medial cortical margin of the left third anterolateral rib. IMPRESSION: 1. Low cervical, mediastinal, hilar, axillary and upper abdominal adenopathy, compatible with the provided history of lymphoma and better evaluated on PET 09/14/2023. 2. Cavitary masslike consolidation in the left upper lobe with an air-fluid level, worrisome for an abscess. Additional adjacent cavitary consolidation in the lingula may be infectious/inflammatory in etiology. 3. Enlarging bilateral pulmonary nodules, indicative of pulmonary lymphoma. 4. Extensive edema, asymmetric enlargement and skin thickening involving the left breast, new from 09/14/2023. Lymphoma or breast cancer could have this appearance. 5. Vague hypermetabolic sclerotic vague hypodense lesion in the peripheral right hepatic lobe, worrisome for a metastasis. 6. Small loculated pleural effusion/thickening. 7. Inferior right breast nodule, new from 09/14/2023, favoring a benign etiology. Electronically Signed   By: Newell Eke M.D.   On:  12/21/2023 15:29   DG CHEST PORT 1 VIEW Result Date: 12/21/2023 CLINICAL DATA:  Follow-up pneumonia. EXAM: PORTABLE CHEST 1 VIEW COMPARISON:  12/19/2023 FINDINGS: Right-sided PICC line unchanged. Persistent moderate opacification over the left lung likely large effusion with associated atelectasis. Possible left midlung/perihilar airspace process which may be due to infection. Slight improvement patchy opacification of the right perihilar region. Cardiomediastinal silhouette and remainder of the exam is unchanged. IMPRESSION: 1. Persistent moderate opacification over the left lung likely large effusion with associated atelectasis. Possible left midlung/perihilar airspace process which may be due to infection. 2. Slight improvement patchy opacification of the right perihilar region. Electronically Signed   By: Toribio Agreste M.D.   On: 12/21/2023 13:01   DG Chest 2 View Result Date: 12/19/2023 CLINICAL DATA:  Cough. EXAM: CHEST - 2 VIEW COMPARISON:  Chest radiograph dated 10/26/2023. FINDINGS: Right-sided PICC with tip at the cavoatrial junction. Small left pleural effusion, new or increased in size since the prior radiograph. Left upper lobe cavitary lesion again noted. Scattered nodular densities in the right lung and diffuse left lung opacities most consistent with pneumonia. Stable cardiac silhouette. No pneumothorax. No acute osseous pathology.  IMPRESSION: 1. Small left pleural effusion, new or increased in size since the prior radiograph. 2. Left upper lobe cavitary lesion and bilateral pneumonia. Electronically Signed   By: Vanetta Chou M.D.   On: 12/19/2023 14:21    Microbiology: Results for orders placed or performed during the hospital encounter of 12/19/23  Gastrointestinal Panel by PCR , Stool     Status: None   Collection Time: 12/19/23  2:40 PM   Specimen: Peripheral; Stool  Result Value Ref Range Status   Campylobacter species NOT DETECTED NOT DETECTED Final   Plesimonas  shigelloides NOT DETECTED NOT DETECTED Final   Salmonella species NOT DETECTED NOT DETECTED Final   Yersinia enterocolitica NOT DETECTED NOT DETECTED Final   Vibrio species NOT DETECTED NOT DETECTED Final   Vibrio cholerae NOT DETECTED NOT DETECTED Final   Enteroaggregative E coli (EAEC) NOT DETECTED NOT DETECTED Final   Enteropathogenic E coli (EPEC) NOT DETECTED NOT DETECTED Final   Enterotoxigenic E coli (ETEC) NOT DETECTED NOT DETECTED Final   Shiga like toxin producing E coli (STEC) NOT DETECTED NOT DETECTED Final   Shigella/Enteroinvasive E coli (EIEC) NOT DETECTED NOT DETECTED Final   Cryptosporidium NOT DETECTED NOT DETECTED Final   Cyclospora cayetanensis NOT DETECTED NOT DETECTED Final   Entamoeba histolytica NOT DETECTED NOT DETECTED Final   Giardia lamblia NOT DETECTED NOT DETECTED Final   Adenovirus F40/41 NOT DETECTED NOT DETECTED Final   Astrovirus NOT DETECTED NOT DETECTED Final   Norovirus GI/GII NOT DETECTED NOT DETECTED Final   Rotavirus A NOT DETECTED NOT DETECTED Final   Sapovirus (I, II, IV, and V) NOT DETECTED NOT DETECTED Final    Comment: Performed at [facility], [address] [place], [place] [address]  C Difficile Quick Screen w PCR reflex     Status: None   Collection Time: 12/19/23  2:40 PM   Specimen: Peripheral; Stool  Result Value Ref Range Status   C Diff antigen NEGATIVE NEGATIVE Final   C Diff toxin NEGATIVE NEGATIVE Final   C Diff interpretation No C. difficile detected.  Final    Comment: Performed at [facility], [address], [place], [place] [address]  Blood culture (routine x 2)     Status: None (Preliminary result)   Collection Time: 12/19/23  9:30 PM   Specimen: BLOOD  Result Value Ref Range Status   Specimen Description   Final    BLOOD LEFT ANTECUBITAL Performed at [facility], [address] [place], [place] [address]    Special Requests   Final    BOTTLES DRAWN AEROBIC ONLY  Blood Culture adequate volume Performed at [facility], [address] [place], [place] [address]    Culture   Final    NO GROWTH 1 DAY Performed at [facility], [address] [place], [place] [address]    Report Status PENDING  Incomplete  Blood culture (routine x 2)     Status: None (Preliminary result)   Collection Time: 12/19/23  9:30 PM   Specimen: BLOOD  Result Value Ref Range Status   Specimen Description   Final    BLOOD BLOOD LEFT HAND Performed at [facility], [address] [place], [place] [address]    Special Requests   Final    BOTTLES DRAWN AEROBIC ONLY Blood Culture adequate volume Performed at [facility], [address], [place], [place] [address]    Culture   Final  NO GROWTH 1 DAY Performed at [facility], [address] [place], [place] [address]    Report Status PENDING  Incomplete  MRSA Next Gen by PCR, Nasal     Status: None   Collection Time: 12/20/23  1:13 AM   Specimen: Nasal Mucosa; Nasal Swab  Result Value Ref Range Status   MRSA by PCR Next Gen NOT DETECTED NOT DETECTED Final    Comment: (NOTE) The GeneXpert MRSA Assay (FDA approved for NASAL specimens only), is one component of a comprehensive MRSA colonization surveillance program. It is not intended to diagnose MRSA infection nor to guide or monitor treatment for MRSA infections. Test performance is not FDA approved in patients less than 54 years old. Performed at [facility], [address] [place], [place] [address]     Labs: CBC: Recent Labs  Lab 12/19/23 1250 12/20/23 0400 12/20/23 1633 12/21/23 1052  WBC 38.2* 29.4* 28.8* 30.4*  NEUTROABS 31.0*  --  21.0*  --   HGB 8.0* 6.5* 8.1* 8.1*  HCT 27.1* 21.9* 26.0* 26.5*  MCV 85.2 84.9 84.1 85.5  PLT 476* 337 373 443*   Basic Metabolic Panel: Recent Labs  Lab 12/19/23 1250 12/20/23 0400 12/21/23 1052  NA 136 138 137  K 3.1*  3.1* 3.4*  CL 97* 101 101  CO2 28 28 26   GLUCOSE 99 94 99  BUN 7 7 7   CREATININE 0.47 0.44 0.51  CALCIUM  10.4* 9.9 10.5*   Liver Function Tests: Recent Labs  Lab 12/19/23 1250  AST 30  ALT 16  ALKPHOS 375*  BILITOT 0.9  PROT 6.6  ALBUMIN 2.4*   CBG: No results for input(s): GLUCAP in the last 168 hours.  Discharge time spent: greater than 30 minutes.  Signed: Owen DELENA Lore, MD [facility] 12/21/2023

## 2023-12-22 NOTE — Plan of Care (Signed)
 I was called about this patient who is being actively discharged at this time  Untreated lymphoma with breast/chest soft tissue induration/maceration and chronic left upper lobe/linguila lung cavitary process of at least 2 months  Treated for abx of several courses  Sent in 12/17 by pcp for low hb  Repeat chest ct 12/17 1. Low cervical, mediastinal, hilar, axillary and upper abdominal adenopathy, compatible with the provided history of lymphoma and better evaluated on PET 09/14/2023. 2. Cavitary masslike consolidation in the left upper lobe with an air-fluid level, worrisome for an abscess. Additional adjacent cavitary consolidation in the lingula may be infectious/inflammatory in etiology. 3. Enlarging bilateral pulmonary nodules, indicative of pulmonary lymphoma. 4. Extensive edema, asymmetric enlargement and skin thickening involving the left breast, new from 09/14/2023. Lymphoma or breast cancer could have this appearance. 5. Vague hypermetabolic sclerotic vague hypodense lesion in the peripheral right hepatic lobe, worrisome for a metastasis. 6. Small loculated pleural effusion/thickening. 7. Inferior right breast nodule, new from 09/14/2023, favoring a benign etiology.  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 This happen a few months ago  Pulmonary had evaluated and recommend patient to quickly make decision regarding treatment of lymphoma  I discussed with pulm and it is unclear she has infected cavitary changes or what it was due to. Low suspicion for tb  10/2023 bronch afb cx negative and cytology negative   We decided to treat for pna with levo/flagyl ; she should have outpatient pcn testing   I discussed that ultimately we need more tissue  culture to find out what the cavitary change is from but suspect malignancy related with maybe superimposed typical infectious process  She had seen dr Fayette in the past and I had requested rcid staff to make her f/u with dr Fayette. She'll f/u with pulm as well 4 weeks levoflox/flagyl  planned

## 2023-12-22 NOTE — Telephone Encounter (Signed)
 ATC LVMTCB to get pt scheduled for hospital follow-up.  Front, can you please get this pt scheduled?

## 2023-12-25 LAB — CULTURE, BLOOD (ROUTINE X 2)
Culture: NO GROWTH
Culture: NO GROWTH
Special Requests: ADEQUATE
Special Requests: ADEQUATE

## 2023-12-26 NOTE — Telephone Encounter (Signed)
 Attempted to call patient. Left voicemail for patient to call office to get scheduled for follow up with Dr.Hunsucker.

## 2024-01-10 ENCOUNTER — Ambulatory Visit: Admitting: Infectious Diseases

## 2024-01-12 ENCOUNTER — Encounter (HOSPITAL_COMMUNITY): Payer: Self-pay

## 2024-01-12 ENCOUNTER — Ambulatory Visit (HOSPITAL_COMMUNITY)
Admission: RE | Admit: 2024-01-12 | Discharge: 2024-01-12 | Disposition: A | Source: Ambulatory Visit | Attending: Physical Medicine and Rehabilitation | Admitting: Physical Medicine and Rehabilitation

## 2024-01-12 DIAGNOSIS — C8590 Non-Hodgkin lymphoma, unspecified, unspecified site: Secondary | ICD-10-CM | POA: Diagnosis present

## 2024-01-12 LAB — GLUCOSE, CAPILLARY: Glucose-Capillary: 93 mg/dL (ref 70–99)

## 2024-01-12 MED ORDER — FLUDEOXYGLUCOSE F - 18 (FDG) INJECTION
8.1300 | Freq: Once | INTRAVENOUS | Status: AC | PRN
  Administered 2024-01-12: 8.13 via INTRAVENOUS

## 2024-01-26 ENCOUNTER — Ambulatory Visit (HOSPITAL_COMMUNITY)
Admission: RE | Admit: 2024-01-26 | Discharge: 2024-01-26 | Disposition: A | Discharge status: Home / Self Care | Source: Ambulatory Visit | Attending: Physical Medicine and Rehabilitation | Admitting: Physical Medicine and Rehabilitation

## 2024-01-26 ENCOUNTER — Other Ambulatory Visit (HOSPITAL_COMMUNITY): Payer: Self-pay | Admitting: Physical Medicine and Rehabilitation

## 2024-01-26 DIAGNOSIS — T82514A Breakdown (mechanical) of infusion catheter, initial encounter: Secondary | ICD-10-CM | POA: Diagnosis present

## 2024-01-26 DIAGNOSIS — T82898A Other specified complication of vascular prosthetic devices, implants and grafts, initial encounter: Secondary | ICD-10-CM

## 2024-01-26 DIAGNOSIS — Y712 Prosthetic and other implants, materials and accessory cardiovascular devices associated with adverse incidents: Secondary | ICD-10-CM | POA: Insufficient documentation

## 2024-01-26 MED ORDER — HEPARIN SOD (PORK) LOCK FLUSH 100 UNIT/ML IV SOLN
INTRAVENOUS | Status: AC
  Filled 2024-01-26: qty 5

## 2024-01-26 MED ORDER — HEPARIN SOD (PORK) LOCK FLUSH 100 UNIT/ML IV SOLN: 500.0000 [IU] | Freq: Once | INTRAVENOUS | Status: DC

## 2024-01-30 ENCOUNTER — Ambulatory Visit: Admitting: Pulmonary Disease

## 2024-01-31 ENCOUNTER — Encounter (HOSPITAL_BASED_OUTPATIENT_CLINIC_OR_DEPARTMENT_OTHER): Admitting: General Surgery

## 2024-02-01 ENCOUNTER — Encounter (HOSPITAL_BASED_OUTPATIENT_CLINIC_OR_DEPARTMENT_OTHER): Attending: Internal Medicine | Admitting: Internal Medicine

## 2024-02-01 ENCOUNTER — Encounter: Payer: Self-pay | Admitting: Pulmonary Disease

## 2024-02-01 ENCOUNTER — Ambulatory Visit (INDEPENDENT_AMBULATORY_CARE_PROVIDER_SITE_OTHER): Admitting: Pulmonary Disease

## 2024-02-01 VITALS — BP 115/75 | HR 145 | Temp 98.9°F | Ht 67.0 in | Wt 150.0 lb

## 2024-02-01 DIAGNOSIS — X58XXXA Exposure to other specified factors, initial encounter: Secondary | ICD-10-CM | POA: Diagnosis not present

## 2024-02-01 DIAGNOSIS — R918 Other nonspecific abnormal finding of lung field: Secondary | ICD-10-CM | POA: Diagnosis not present

## 2024-02-01 DIAGNOSIS — C8190 Hodgkin lymphoma, unspecified, unspecified site: Secondary | ICD-10-CM | POA: Insufficient documentation

## 2024-02-01 DIAGNOSIS — J42 Unspecified chronic bronchitis: Secondary | ICD-10-CM

## 2024-02-01 DIAGNOSIS — S21002A Unspecified open wound of left breast, initial encounter: Secondary | ICD-10-CM | POA: Insufficient documentation

## 2024-02-01 DIAGNOSIS — J479 Bronchiectasis, uncomplicated: Secondary | ICD-10-CM

## 2024-02-01 DIAGNOSIS — J189 Pneumonia, unspecified organism: Secondary | ICD-10-CM

## 2024-02-01 DIAGNOSIS — Z8579 Personal history of other malignant neoplasms of lymphoid, hematopoietic and related tissues: Secondary | ICD-10-CM

## 2024-02-01 DIAGNOSIS — Z87891 Personal history of nicotine dependence: Secondary | ICD-10-CM

## 2024-02-01 NOTE — Progress Notes (Signed)
 "  @Patient  ID: Briana Pham, female    DOB: 1985/11/24, 39 y.o.   MRN: [id number]  Chief Complaint  Patient presents with   Follow-up    Patient has concerns if there in any pnuemonia present. Patient has concerns if any plorafusion present.     Referring provider: Ambrosio Elouise HERO, MD  HPI:   39 y.o. whom we are seeing for hospital follow-up of pneumonia, cavitary lung lesion superinfection.  Multiple hospital notes reviewed.  Including pulmonary and infectious disease notes.  Discharge summary reviewed.  Returns for hospital follow-up.  Overall doing well.  Minimal cough.  Repeat CT scan, PET scan for her lymphoma, 01/12/2024 showed marked improvement in air-fluid level of the cavity, some thickening persisting, chronic change on my review and interpretation.  Discussed at length great radiologic improvement and area of pneumonia.  She completed her 4 weeks of Levaquin  and Flagyl .  We discussed her PET avid lesions.  Likely reflective of lymphoma.  She states she has appointment tomorrow with her doctor to discuss options.  We discussed her mucus.  Likely combination of postnasal drip and occasional buildup of mucus in the lungs related to cavitary lesion or bronchiectasis.  Agreed to use flutter valve for airway clearance to start with.  Questionaires / Pulmonary Flowsheets:   ACT:      No data to display          MMRC:     No data to display          Epworth:      No data to display          Tests:   FENO:  No results found for: NITRICOXIDE  PFT:     No data to display          WALK:      No data to display          Imaging: Personally reviewed and as per EMR and discussion in this note IR PICC REPLACEMENT RIGHT INC IMG GUIDE Result Date: 01/27/2024 INDICATION: Malfunctioning PICC line, partially retracted. EXAM: FLUOROSCOPIC GUIDED PICC LINE EXCHANGE MEDICATIONS: None. CONTRAST:  None FLUOROSCOPY TIME:  One mGy reference air kerma  COMPLICATIONS: None immediate. TECHNIQUE: The procedure, risks, benefits, and alternatives were explained to the patient and informed written consent was obtained. The right upper extremity and external portion of the existing PICC line was prepped with chlorhexidine  in a sterile fashion, and a sterile drape was applied covering the operative field. Maximum barrier sterile technique with sterile gowns and gloves were used for the procedure. A timeout was performed prior to the initiation of the procedure. Local anesthesia was provided with 1% lidocaine . The existing PICC line was cannulated with an 0.018 wire which was advanced through the catheter. The catheter was exchanged for 39-cm, 5 - French, dual lumen PICC line to the level of the superior caval atrial junction. A post procedure spot fluoroscopic image was obtained. The catheter easily aspirated and flushed and was sutured in place. A dressing was placed. The patient tolerated the procedure well without immediate post procedural complication. FINDINGS: After catheter exchange, the tip lies within the superior cavoatrial junction. The catheter aspirates and flushes normally and is ready for immediate use. IMPRESSION: Successful fluoroscopic guided exchange of right upper extremity approach 39 cm, 5 - French, dual lumen PICC with tip overlying the superior caval atrial junction. The PICC line is ready for immediate use. Ester Sides, MD [facility] Radiology Electronically  Signed   By: Ester Sides M.D.   On: 01/27/2024 08:54   NM PET Image Restage (PS) Skull Base to Thigh (F-18 FDG) Result Date: 01/17/2024 CLINICAL DATA:  Subsequent treatment strategy for recurrent lymphoma. EXAM: NUCLEAR MEDICINE PET SKULL BASE TO THIGH TECHNIQUE: 8.13 mCi F-18 FDG was injected intravenously. Full-ring PET imaging was performed from the skull base to thigh after the radiotracer. CT data was obtained and used for  attenuation correction and anatomic localization. Fasting blood glucose: 93 mg/dl COMPARISON:  PET-CT 90/89/7974.  CT of the chest 12/21/2023. FINDINGS: Mediastinal blood pool activity: SUV max 1.9 Liver activity: SUV max 2.9 NECK: Hypermetabolic cervical lymphadenopathy again noted bilaterally. The dominant level 2 nodal mass on the left has decreased in size and metabolic activity, currently measuring 1.4 cm short axis on image 28/4 (SUV max 6.3, previously 17.3). Right cervical involvement also appears mildly improved. No definite new or enlarging lymph nodes identified in the neck. No suspicious activity identified within the pharyngeal mucosal space. Incidental CT findings: Left maxillary sinus retention cyst. CHEST: Again demonstrated are multiple hypermetabolic mediastinal, hilar and axillary lymph nodes. There is decreased metabolic activity within a left supraclavicular node (currently within SUV max of 6.2, previously 24.4). Left hilar activity is also slightly decreased (SUV max 9.8, previously 13.4). However, there is new hypermetabolic subcarinal activity (SUV max 12.2). Enlarged axillary lymph nodes bilaterally have enlarged and become less well-defined compared with the previous PET-CT. The associated hypermetabolic activity has improved. Similar appearance of large cavitary lesion medially in the left upper lobe with hypermetabolic thickened walls. The hypermetabolic activity along the inferior aspect of this lesion has become slightly more prominent. In addition, there are other enlarging hypermetabolic pulmonary nodules bilaterally, including a 1.8 cm right upper lobe nodule on image 51/4 (SUV max 8.9). Incidental CT findings: Right arm PICC projects to the superior cavoatrial junction. Small left pleural effusion. ABDOMEN/PELVIS: Interval increased heterogeneous hypermetabolic activity with in the spleen (SUV max 8.4 posteriorly). Stable mild splenomegaly. Again demonstrated are multiple  hypermetabolic lymph nodes within the abdomen and pelvis. Some of these have improved, most notably within the porta hepatis, retroperitoneum and bilateral inguinal regions. For example, remaining left inguinal lymph nodes have an SUV max of 7.5 (previously 16.9). However, there are some peripancreatic lymph nodes which have enlarged with increased metabolic activity, including an ill-defined node superior to the pancreatic head with an SUV max of 12.2. No hypermetabolic activity identified within the liver or adrenal glands. Incidental CT findings: Dependent high density within the gallbladder lumen, not previously demonstrated, likely sludge. No hydronephrosis. Trace ascites. SKELETON: New heterogeneously increased metabolic activity within the spine, most prominent at T8 (SUV max 5.7), suspicious for lymphomatous involvement. Compared with the previous PET-CT, there is generalized increased body wall edema with asymmetric involvement of the left breast. Within the left breast, there is new generalized heterogeneous hypermetabolic activity (SUV max 7.3 superiorly). Overall appearance of the left breast is similar to the chest CT of 3 weeks ago. Incidental CT findings: As above, increased generalized body wall edema compared with previous PET-CT. IMPRESSION: 1. Mixed response to therapy compared with previous PET-CT of 09/14/2023. 2. Some of the previously demonstrated hypermetabolic lymph nodes in the neck, chest, abdomen and pelvis have decreased in size and metabolic activity. However, there are new hypermetabolic subcarinal and peripancreatic lymph nodes. Deauville 5. 3. Interval increased heterogeneous hypermetabolic activity within the spleen consistent with lymphomatous involvement. 4. New heterogeneous hypermetabolic activity within the spine, most prominent at  T8, suspicious for lymphomatous involvement. 5. Similar appearance of large cavitary lesion medially in the left upper lobe with hypermetabolic  thickened walls. In addition, there are other enlarging hypermetabolic pulmonary nodules bilaterally, presumably progressive lymphoma. 6. New generalized heterogeneous hypermetabolic activity within the left breast with associated asymmetric breast enlargement and edema. This could reflect lymphomatous involvement of the breast or mastitis. Correlate clinically. 7. Increased generalized body wall edema compared with previous PET-CT. Electronically Signed   By: Elsie Perone M.D.   On: 01/17/2024 11:52    Lab Results: Personally reviewed CBC    Component Value Date/Time   WBC 30.4 (H) 12/21/2023 1052   RBC 3.10 (L) 12/21/2023 1052   HGB 8.1 (L) 12/21/2023 1052   HCT 26.5 (L) 12/21/2023 1052   PLT 443 (H) 12/21/2023 1052   MCV 85.5 12/21/2023 1052   MCH 26.1 12/21/2023 1052   MCHC 30.6 12/21/2023 1052   RDW 19.8 (H) 12/21/2023 1052   LYMPHSABS 1.2 12/20/2023 1633   MONOABS 2.5 (H) 12/20/2023 1633   EOSABS 3.4 (H) 12/20/2023 1633   BASOSABS 0.1 12/20/2023 1633    BMET    Component Value Date/Time   NA 137 12/21/2023 1052   K 3.4 (L) 12/21/2023 1052   CL 101 12/21/2023 1052   CO2 26 12/21/2023 1052   GLUCOSE 99 12/21/2023 1052   BUN 7 12/21/2023 1052   CREATININE 0.51 12/21/2023 1052   CALCIUM  10.5 (H) 12/21/2023 1052   GFRNONAA >60 12/21/2023 1052   GFRAA >60 12/08/2018 1146    BNP No results found for: BNP  ProBNP No results found for: PROBNP  Specialty Problems       Pulmonary Problems   Cavitary lesion of lung   Pneumonia due to infectious organism   HCAP (healthcare-associated pneumonia)   Multifocal pneumonia    Allergies[1]  Immunization History  Administered Date(s) Administered   Tdap 01/04/2017    Past Medical History:  Diagnosis Date   Anxiety    Arrest of dilation, delivered, current hospitalization 12/10/2018   Asthma    inhaler used in 2017   Cavitary lesion of lung    LUL   Insomnia    Lymphoma (HCC)    Mental disorder     Pregnancy induced hypertension    Unilateral vocal cord paralysis    Left    Tobacco History: Tobacco Use History[2] Counseling given: Not Answered   Continue to not smoke  Outpatient Encounter Medications as of 02/01/2024  Medication Sig   ipratropium-albuterol  (DUONEB) 0.5-2.5 (3) MG/3ML SOLN Take 3 mLs by nebulization daily as needed (wheezing/sob).   levothyroxine  (SYNTHROID ) 25 MCG tablet Take 37.5 mcg by mouth daily.   progesterone  (PROMETRIUM ) 200 MG capsule Take 200 mg by mouth at bedtime.   JOURNAVX 50 MG TABS Take 50 mg by mouth daily as needed (pain). (Patient not taking: Reported on 02/01/2024)   ketorolac  (TORADOL ) 10 MG tablet Take 10 mg by mouth daily as needed for moderate pain (pain score 4-6) or severe pain (pain score 7-10). (Patient not taking: Reported on 02/01/2024)   levofloxacin  (LEVAQUIN ) 750 MG tablet Take 1 tablet (750 mg total) by mouth daily. (Patient not taking: Reported on 02/01/2024)   liothyronine  (CYTOMEL ) 5 MCG tablet Take 20 mcg by mouth every morning.   potassium chloride  SA (KLOR-CON  M) 20 MEQ tablet Take 20 mEq by mouth daily. (Patient not taking: Reported on 02/01/2024)   topiramate  (TOPAMAX ) 25 MG tablet Take 25 mg by mouth 2 (two) times daily as needed (  headaches). (Patient not taking: Reported on 02/01/2024)   No facility-administered encounter medications on file as of 02/01/2024.     Review of Systems  Review of Systems  Comprehensive review of system negative unless mentioned in HPI above Physical Exam  BP 115/75 (BP Location: Left Arm, Patient Position: Sitting, Cuff Size: Normal)   Pulse (!) 145   Temp 98.9 F (37.2 C) (Oral)   Ht 5' 7 (1.702 m)   Wt 150 lb (68 kg)   SpO2 96%   BMI 23.49 kg/m   Wt Readings from Last 5 Encounters:  02/01/24 150 lb (68 kg)  12/19/23 170 lb (77.1 kg)  10/28/23 169 lb 15.6 oz (77.1 kg)  10/10/23 170 lb (77.1 kg)  09/19/23 166 lb (75.3 kg)    BMI Readings from Last 5 Encounters:  02/01/24 23.49  kg/m  12/19/23 26.63 kg/m  10/28/23 26.62 kg/m  10/10/23 26.63 kg/m  09/19/23 26.00 kg/m     Physical Exam General: In chair, anxious appearing Eyes: EOMI no icterus Neck: No JVP Pulmonary: Good air excursion bilaterally, bronchial breath sounds on the left upper lung fields, no wheezing Cardiovascular: Tachycardic Abdomen: Nondistended   Assessment & Plan:   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  Subsequent superinfection with decreased blood flow etc.  With ongoing risk of relative immunocompromise with active lymphoma.  Improved with 4 weeks of Levaquin  and Flagyl  on most recent CT scan.  Encouraging.  Bronchiectasis: Related to recurrent infections.  Start flutter valve for mucus, airway clearance.  Fortunately, at times when not exacerbating she has minimal cough and mucus production.  PET avid lesions of the chest: Likely reflective of underlying lymphoma.  She has upcoming appointment with her doctor to discuss treatment options, tomorrow per her report.   Return in about 3 months (around 05/01/2024) for f/u Dr. Annella.   Donnice JONELLE Annella, MD 02/01/2024   This appointment required 41 minutes of patient care (this includes precharting, chart review, review of results, face-to-face care, etc.).     [1]  Allergies Allergen Reactions   Azithromycin Anaphylaxis and Itching    Rash, crawly skin, could not form sentences, Confusion   Penicillins Hives and Itching    Confusion. ALL CILLIN FAMILY*  [2]  Social History Tobacco Use  Smoking Status Former   Types: Cigarettes  Smokeless Tobacco Never   "

## 2024-02-01 NOTE — Patient Instructions (Signed)
 It is nice to see you again  No changes to medication today  On the repeat CT scan you had for your PET scan, the infection and pneumonia looks much improved.  This is great news.  I think the antibiotics did a good job.  Use the flutter valve 4 to 6 breaths every minute, repeat this for 5-6 times, 5 to 6 minutes.  This is 1 session.  Complete 2-4 sessions a day if able.  A minimum of 2 would be great.  Return to clinic in 3 months or sooner as needed

## 2024-02-06 ENCOUNTER — Encounter (HOSPITAL_BASED_OUTPATIENT_CLINIC_OR_DEPARTMENT_OTHER): Admitting: Internal Medicine

## 2024-02-08 ENCOUNTER — Encounter (HOSPITAL_BASED_OUTPATIENT_CLINIC_OR_DEPARTMENT_OTHER): Admitting: Internal Medicine

## 2024-02-15 ENCOUNTER — Encounter (HOSPITAL_BASED_OUTPATIENT_CLINIC_OR_DEPARTMENT_OTHER): Admitting: Internal Medicine

## 2024-02-20 ENCOUNTER — Ambulatory Visit: Admitting: Pulmonary Disease

## 2024-04-30 ENCOUNTER — Ambulatory Visit: Admitting: Pulmonary Disease
